# Patient Record
Sex: Female | Born: 1970 | Hispanic: Yes | Marital: Single | Smoking: Never smoker
Health system: Southern US, Community
[De-identification: ages and names within clinical notes are randomized; demographics above are authoritative.]

## PROBLEM LIST (undated history)

## (undated) DIAGNOSIS — M069 Rheumatoid arthritis, unspecified: Secondary | ICD-10-CM

## (undated) DIAGNOSIS — M1990 Unspecified osteoarthritis, unspecified site: Secondary | ICD-10-CM

## (undated) DIAGNOSIS — G43909 Migraine, unspecified, not intractable, without status migrainosus: Secondary | ICD-10-CM

## (undated) DIAGNOSIS — J45909 Unspecified asthma, uncomplicated: Secondary | ICD-10-CM

## (undated) HISTORY — PX: CHOLECYSTECTOMY: SHX55

---

## 2019-03-21 ENCOUNTER — Emergency Department: Payer: Self-pay

## 2019-03-21 ENCOUNTER — Other Ambulatory Visit: Payer: Self-pay

## 2019-03-21 ENCOUNTER — Encounter: Payer: Self-pay | Admitting: *Deleted

## 2019-03-21 ENCOUNTER — Emergency Department
Admission: EM | Admit: 2019-03-21 | Discharge: 2019-03-21 | Disposition: A | Discharge status: Home / Self Care | Payer: Self-pay | Attending: Emergency Medicine | Admitting: Emergency Medicine

## 2019-03-21 DIAGNOSIS — R21 Rash and other nonspecific skin eruption: Secondary | ICD-10-CM | POA: Insufficient documentation

## 2019-03-21 DIAGNOSIS — J45909 Unspecified asthma, uncomplicated: Secondary | ICD-10-CM | POA: Insufficient documentation

## 2019-03-21 HISTORY — DX: Rheumatoid arthritis, unspecified: M06.9

## 2019-03-21 HISTORY — DX: Unspecified osteoarthritis, unspecified site: M19.90

## 2019-03-21 HISTORY — DX: Unspecified asthma, uncomplicated: J45.909

## 2019-03-21 LAB — COMPREHENSIVE METABOLIC PANEL
ALT: 23 U/L (ref 0–44)
AST: 29 U/L (ref 15–41)
Albumin: 4.2 g/dL (ref 3.5–5.0)
Alkaline Phosphatase: 79 U/L (ref 38–126)
Anion gap: 8 (ref 5–15)
BUN: 23 mg/dL — ABNORMAL HIGH (ref 6–20)
CO2: 23 mmol/L (ref 22–32)
Calcium: 8.9 mg/dL (ref 8.9–10.3)
Chloride: 106 mmol/L (ref 98–111)
Creatinine, Ser: 0.75 mg/dL (ref 0.44–1.00)
GFR calc Af Amer: >60 mL/min (ref >60)
GFR calc non Af Amer: >60 mL/min (ref >60)
Glucose, Bld: 120 mg/dL — ABNORMAL HIGH (ref 70–99)
Potassium: 4.3 mmol/L (ref 3.5–5.1)
Sodium: 137 mmol/L (ref 135–145)
Total Bilirubin: 1.2 mg/dL (ref 0.3–1.2)
Total Protein: 7 g/dL (ref 6.5–8.1)

## 2019-03-21 LAB — CBC WITH DIFFERENTIAL/PLATELET
Abs Immature Granulocytes: 0.03 10*3/uL (ref 0.00–0.07)
Basophils Absolute: 0 10*3/uL (ref 0.0–0.1)
Basophils Relative: 1 %
Eosinophils Absolute: 0.4 10*3/uL (ref 0.0–0.5)
Eosinophils Relative: 6 %
HCT: 41.5 % (ref 36.0–46.0)
Hemoglobin: 13.7 g/dL (ref 12.0–15.0)
Immature Granulocytes: 1 %
Lymphocytes Relative: 32 %
Lymphs Abs: 1.8 10*3/uL (ref 0.7–4.0)
MCH: 27.2 pg (ref 26.0–34.0)
MCHC: 33 g/dL (ref 30.0–36.0)
MCV: 82.3 fL (ref 80.0–100.0)
Monocytes Absolute: 0.4 10*3/uL (ref 0.1–1.0)
Monocytes Relative: 8 %
Neutro Abs: 3 10*3/uL (ref 1.7–7.7)
Neutrophils Relative %: 52 %
Platelets: 177 10*3/uL (ref 150–400)
RBC: 5.04 MIL/uL (ref 3.87–5.11)
RDW: 13 % (ref 11.5–15.5)
WBC: 5.6 10*3/uL (ref 4.0–10.5)
nRBC: 0 % (ref 0.0–0.2)

## 2019-03-21 MED ORDER — CLINDAMYCIN HCL 150 MG PO CAPS
300.0000 mg | ORAL_CAPSULE | Freq: Once | ORAL | Status: AC
  Administered 2019-03-21: 300 mg via ORAL
  Filled 2019-03-21: qty 2

## 2019-03-21 MED ORDER — TRAMADOL HCL 50 MG PO TABS
50.0000 mg | ORAL_TABLET | Freq: Once | ORAL | Status: AC
  Administered 2019-03-21: 50 mg via ORAL
  Filled 2019-03-21: qty 1

## 2019-03-21 MED ORDER — TRAMADOL HCL 50 MG PO TABS: 50.0000 mg | ORAL_TABLET | Freq: Four times a day (QID) | ORAL | 0 refills | Status: DC | PRN

## 2019-03-21 MED ORDER — CLINDAMYCIN HCL 300 MG PO CAPS: 300.0000 mg | ORAL_CAPSULE | Freq: Three times a day (TID) | ORAL | 0 refills | Status: DC

## 2019-03-21 MED ORDER — CLINDAMYCIN HCL 300 MG PO CAPS: 300.0000 mg | ORAL_CAPSULE | Freq: Three times a day (TID) | ORAL | 0 refills | Status: AC

## 2019-03-21 NOTE — ED Provider Notes (Signed)
[facility] Emergency Department Provider Note  ____________________________________________  Time seen: Approximately 3:05 PM  I have reviewed the triage vital signs and the nursing notes.   HISTORY  Chief Complaint Rash    HPI Nichole Martin is a 48 y.o. female who presents to the emergency department complaining of ongoing cellulitis/leg ulcers x2 months.  Patient reports that she has a history of recurrent "infections" to the bilateral lower extremities.  Patient reports that approximately 2 months ago she developed a "pinhole" wound to the anterior left lower extremity.  Patient reports that this worsens, developed erythema so she presented to her primary care provider who diagnosed her with cellulitis.  Patient was initially started on Keflex, had allergic reaction to same.  This antibiotic was stopped and patient was placed on a second antibiotic.  Patient is unsure what the second antibiotic was.  She states that she took the entire length of antibiotics, her wound appeared to heal somewhat, but after finishing the antibiotics it is slowly worsened.  She reports that it is mildly painful, it is erythematous but she has had no purulent drainage or worsening of skin erosions.  Patient denies any significant past medical history to include hypertension, diabetes.  Patient states that she called her primary care provider today and was referred to the emergency department for evaluation.  She denies any fevers or chills, chest pain, shortness of breath abdominal pain, nausea vomiting.  Other than the prescribed antibiotics, patient has had no medication for this complaint.  Patient states that her previous "infections" have all been on her lower extremities.         Past Medical History:  Diagnosis Date  . Arthritis   . Asthma   . Rheumatoid arthritis (HCC)     There are no active problems to display for this patient.   Past Surgical History:   Procedure Laterality Date  . CESAREAN SECTION    . CHOLECYSTECTOMY      Prior to Admission medications   Not on File    Allergies Indomethacin and Cephalexin  No family history on file.  Social History Social History   Tobacco Use  . Smoking status: Never Smoker  . Smokeless tobacco: Never Used  Substance Use Topics  . Alcohol use: Never    Frequency: Never  . Drug use: Never     Review of Systems  Constitutional: No fever/chills Eyes: No visual changes. No discharge ENT: No upper respiratory complaints. Cardiovascular: no chest pain. Respiratory: no cough. No SOB. Gastrointestinal: No abdominal pain.  No nausea, no vomiting.  No diarrhea.  No constipation. Musculoskeletal: Positive for pain, erythema, edema, skin changes to left lower extremity x2 months Skin: Negative for rash, abrasions, lacerations, ecchymosis. Neurological: Negative for headaches, focal weakness or numbness. 10-point ROS otherwise negative.  ____________________________________________   PHYSICAL EXAM:  VITAL SIGNS: ED Triage Vitals  Enc Vitals Group     BP 03/21/19 1410 133/72     Pulse Rate 03/21/19 1410 84     Resp 03/21/19 1410 18     Temp 03/21/19 1410 97.8 F (36.6 C)     Temp Source 03/21/19 1410 Oral     SpO2 03/21/19 1410 98 %     Weight 03/21/19 1422 215 lb (97.5 kg)     Height 03/21/19 1422 5\' 3"  (1.6 m)     Head Circumference --      Peak Flow --      Pain Score 03/21/19 1422 10  Pain Loc --      Pain Edu? --      Excl. in GC? --      Constitutional: Alert and oriented. Well appearing and in no acute distress. Eyes: Conjunctivae are normal. PERRL. EOMI. Head: Atraumatic. ENT:      Ears:       Nose: No congestion/rhinnorhea.      Mouth/Throat: Mucous membranes are moist.  Neck: No stridor.    Cardiovascular: Normal rate, regular rhythm. Normal S1 and S2.  Good peripheral circulation. Respiratory: Normal respiratory effort without tachypnea or retractions.  Lungs CTAB. Good air entry to the bases with no decreased or absent breath sounds. Musculoskeletal: Full range of motion to all extremities. No gross deformities appreciated.  Visualization of the left lower extremity reveals mild erythema, moderate edema of the left calf region.  Majority of erythema and edema is along the anterior aspect of the lower extremity.  Patient has multiple wounds to the anterior leg.  Surrounding erythema and edema is around these wounds.  There is no purulent drainage.  No fluctuance.  Area is tender to palpation with no palpable abnormalities.  Dorsalis pedis pulse intact distally.  Sensation intact distally.  Total area of the anterior shin measures approximately 10 cm x 8 cm. Neurologic:  Normal speech and language. No gross focal neurologic deficits are appreciated.  Skin:  Skin is warm, dry and intact. No rash noted. Psychiatric: Mood and affect are normal. Speech and behavior are normal. Patient exhibits appropriate insight and judgement.   ____________________________________________   LABS (all labs ordered are listed, but only abnormal results are displayed)  Labs Reviewed  COMPREHENSIVE METABOLIC PANEL - Abnormal; Notable for the following components:      Result Value   Glucose, Bld 120 (*)    BUN 23 (*)    All other components within normal limits  CBC WITH DIFFERENTIAL/PLATELET  CBC WITH DIFFERENTIAL/PLATELET   ____________________________________________  EKG   ____________________________________________  RADIOLOGY I personally viewed and evaluated these images as part of my medical decision making, as well as reviewing the written report by the radiologist.  [place] Venous Img Lower Unilateral Left  Result Date: 03/21/2019 CLINICAL DATA:  Left lower extremity pain, edema EXAM: LEFT LOWER EXTREMITY VENOUS DOPPLER ULTRASOUND TECHNIQUE: Gray-scale sonography with graded compression, as well as color Doppler and duplex ultrasound were performed  to evaluate the lower extremity deep venous systems from the level of the common femoral vein and including the common femoral, femoral, profunda femoral, popliteal and calf veins including the posterior tibial, peroneal and gastrocnemius veins when visible. The superficial great saphenous vein was also interrogated. Spectral Doppler was utilized to evaluate flow at rest and with distal augmentation maneuvers in the common femoral, femoral and popliteal veins. COMPARISON:  None. FINDINGS: Contralateral Common Femoral Vein: Respiratory phasicity is normal and symmetric with the symptomatic side. No evidence of thrombus. Normal compressibility. Common Femoral Vein: No evidence of thrombus. Normal compressibility, respiratory phasicity and response to augmentation. Saphenofemoral Junction: No evidence of thrombus. Normal compressibility and flow on color Doppler imaging. Profunda Femoral Vein: No evidence of thrombus. Normal compressibility and flow on color Doppler imaging. Femoral Vein: No evidence of thrombus. Normal compressibility, respiratory phasicity and response to augmentation. Popliteal Vein: No evidence of thrombus. Normal compressibility, respiratory phasicity and response to augmentation. Calf Veins: No evidence of thrombus. Normal compressibility and flow on color Doppler imaging. Superficial Great Saphenous Vein: No evidence of thrombus. Normal compressibility. Venous Reflux:  Not assessed Other Findings:  None. IMPRESSION: No evidence of deep venous thrombosis. Electronically Signed   By: Jerilynn Mages.  Shick M.D.   On: 03/21/2019 16:44    ____________________________________________    PROCEDURES  Procedure(s) performed:    Procedures    Medications - No data to display   ____________________________________________   INITIAL IMPRESSION / ASSESSMENT AND PLAN / ED COURSE  Pertinent labs & imaging results that were available during my care of the patient were reviewed by me and considered  in my medical decision making (see chart for details).  Review of the [place] CSRS was performed in accordance of the [place] prior to dispensing any controlled drugs.  Clinical Course as of Mar 21 1703  Fri Mar 21, 2019  1606 Patient presented to the emergency department complaining of left lower extremity pain, ongoing superficial wound/skin changes, erythema x2 months.  Patient was placed on Keflex initially and had allergic reaction to same.  Patient then was started on a second antibiotic that she does not know the name of.  She reports that this improved her symptoms somewhat but they never fully alleviated.  She does have a history of recurrent wounds to the bilateral lower extremities.  She denies that these are traumatic.  She states that they started off as "pinholes" and then turned into erosion type wounds.  Patient has been dealing with current left lower extremity findings x2 months.  On evaluation, patient does have erythema, edema of the left lower extremity.  On the anterior aspect of her leg she has multiple superficial erosion type injuries.  Given the chronic nature of these findings, visual evaluation of the area I suspect that these are minimally infected chronic venous stasis ulcerations.  Patient has no history of vascular insufficiency, cardiac history, hypertension or diabetes.  As she has had 2 months of similar appearing wounds without any worsening, I do think they are minimally infected, with very little risk of deep underlying abscess or systemic effects such as sepsis.  I think most of findings are consistent with venous stasis ulcerations.  Labs, ultrasound will be obtained at this point.     [JC]  1194 At this time, CBC hemolyzed prior to results.  There has been difficulty obtaining a second set of lab work.  At this time, patient care will be transferred to colleague, Laban Emperor PA-C.  Patient's history, physical exam, work-up to this point of been discussed with next provider.   Final diagnosis and disposition will be provided by Ms. Shawna Orleans   [JC]    Clinical Course User Index [JC] , Charline Bills, PA-C          Patient care transferred to Laban Emperor, PA-C pending CBC and ultrasound results.  Final diagnosis and disposition will be provided by Ms. Shawna Orleans ____________________________________________  FINAL CLINICAL IMPRESSION(S) / ED DIAGNOSES  Final diagnoses:  None      NEW MEDICATIONS STARTED DURING THIS VISIT:  ED Discharge Orders    None          This chart was dictated using voice recognition software/Dragon. Despite best efforts to proofread, errors can occur which can change the meaning. Any change was purely unintentional.    Darletta Moll, PA-C 03/21/19 1704    Nance Pear, MD 03/21/19 1800

## 2019-03-21 NOTE — ED Notes (Signed)
Pt otf for imaging 

## 2019-03-21 NOTE — Discharge Instructions (Addendum)
Your blood work is all very reassuring.  There is no clot in your leg.  I suspect that you have some venous stasis ulcers to your lower legs from blood not effectively pumping blood to your legs.  I have given you an antibiotic for any infection.  Please follow-up with primary care and wound center for further work-up.

## 2019-03-21 NOTE — ED Notes (Signed)
Lab to come and draw lavender tube that was hemolyzed

## 2019-03-21 NOTE — ED Triage Notes (Signed)
Through Spanish interpreter Nichole Martin, patient states she has had a rash on left lower leg for two months. Patient was started on Cephalexin on 9/11, but had an allergic reaction. Patient was started on a 2nd antibiotic and a steroid for the allergic reaction and finished the 2nd antibiotic. Patient went for a wound check today and was referred here for further treatment for possible cellulitis.

## 2019-04-07 ENCOUNTER — Encounter: Payer: Self-pay | Attending: Physician Assistant | Admitting: Physician Assistant

## 2019-04-07 ENCOUNTER — Other Ambulatory Visit: Payer: Self-pay

## 2019-04-07 DIAGNOSIS — L98492 Non-pressure chronic ulcer of skin of other sites with fat layer exposed: Secondary | ICD-10-CM | POA: Insufficient documentation

## 2019-04-07 DIAGNOSIS — L97812 Non-pressure chronic ulcer of other part of right lower leg with fat layer exposed: Secondary | ICD-10-CM | POA: Insufficient documentation

## 2019-04-07 DIAGNOSIS — Z8249 Family history of ischemic heart disease and other diseases of the circulatory system: Secondary | ICD-10-CM | POA: Insufficient documentation

## 2019-04-07 DIAGNOSIS — Z881 Allergy status to other antibiotic agents status: Secondary | ICD-10-CM | POA: Insufficient documentation

## 2019-04-07 DIAGNOSIS — I872 Venous insufficiency (chronic) (peripheral): Secondary | ICD-10-CM | POA: Insufficient documentation

## 2019-04-07 DIAGNOSIS — I10 Essential (primary) hypertension: Secondary | ICD-10-CM | POA: Insufficient documentation

## 2019-04-07 DIAGNOSIS — M069 Rheumatoid arthritis, unspecified: Secondary | ICD-10-CM | POA: Insufficient documentation

## 2019-04-07 DIAGNOSIS — J45909 Unspecified asthma, uncomplicated: Secondary | ICD-10-CM | POA: Insufficient documentation

## 2019-04-07 DIAGNOSIS — L97822 Non-pressure chronic ulcer of other part of left lower leg with fat layer exposed: Secondary | ICD-10-CM | POA: Insufficient documentation

## 2019-04-07 NOTE — Progress Notes (Signed)
SALVADOR, BIGBEE ([id number]) Visit Report for 04/07/2019 Abuse/Suicide Risk Screen Details Patient Name: Nichole Martin, Nichole Martin Date of Service: 04/07/2019 1:15 PM Medical Record Number: [id number] Patient Account Number: [id number] Date of Birth/Sex: 28-Apr-1971 (48 y.o. F) Treating RN: Curtis Sites Primary Care Deontaye Civello: Tonia Ghent Other Clinician: Referring Kenecia Barren: Phineas Semen Treating Daysia Vandenboom/Extender: Linwood Dibbles, HOYT Weeks in Treatment: 0 Abuse/Suicide Risk Screen Items Answer ABUSE RISK SCREEN: Has anyone close to you tried to hurt or harm you recentlyo No Do you feel uncomfortable with anyone in your familyo No Has anyone forced you do things that you didnot want to doo No Electronic Signature(s) Signed: 04/07/2019 3:46:08 PM By: Curtis Sites Entered By: Curtis Sites on 04/07/2019 13:42:06 Nichole Martin ([id number]) -------------------------------------------------------------------------------- Activities of Daily Living Details Patient Name: Nichole Martin Date of Service: 04/07/2019 1:15 PM Medical Record Number: [id number] Patient Account Number: [id number] Date of Birth/Sex: Mar 15, 1971 (48 y.o. F) Treating RN: Curtis Sites Primary Care Tedford Berg: Tonia Ghent Other Clinician: Referring Rosemarie Galvis: Phineas Semen Treating Isaish Alemu/Extender: Linwood Dibbles, HOYT Weeks in Treatment: 0 Activities of Daily Living Items Answer Activities of Daily Living (Please select one for each item) Drive Automobile Completely Able Take Medications Completely Able Use Telephone Completely Able Care for Appearance Completely Able Use Toilet Completely Able Bath / Shower Completely Able Dress Self Completely Able Feed Self Completely Able Walk Completely Able Get In / Out Bed Completely Able Housework Completely Able Prepare Meals Completely Able Handle Money Completely Able Shop for Self Completely Able Electronic Signature(s) Signed:  04/07/2019 3:46:08 PM By: Curtis Sites Entered By: Curtis Sites on 04/07/2019 13:42:44 Nichole Martin ([id number]) -------------------------------------------------------------------------------- Education Screening Details Patient Name: Nichole Martin Date of Service: 04/07/2019 1:15 PM Medical Record Number: [id number] Patient Account Number: [id number] Date of Birth/Sex: 1970-11-13 (48 y.o. F) Treating RN: Curtis Sites Primary Care Aadarsh Cozort: Tonia Ghent Other Clinician: Referring Jakeria Caissie: Phineas Semen Treating Mairany Bruno/Extender: Linwood Dibbles, HOYT Weeks in Treatment: 0 Primary Learner Assessed: Patient Learning Preferences/Education Level/Primary Language Learning Preference: Explanation, Demonstration Highest Education Level: High School Preferred Language: Spanish; Set designer Language Barrier: Yes Translator Needed: No Memory Deficit: No Emotional Barrier: No Cultural/Religious Beliefs Affecting Medical Care: No Physical Barrier Impaired Vision: No Impaired Hearing: No Decreased Hand dexterity: No Knowledge/Comprehension Knowledge Level: Medium Comprehension Level: Medium Ability to understand written Medium instructions: Ability to understand verbal Medium instructions: Motivation Anxiety Level: Calm Cooperation: Cooperative Education Importance: Acknowledges Need Interest in Health Problems: Asks Questions Perception: Coherent Willingness to Engage in Self- Medium Management Activities: Readiness to Engage in Self- Medium Management Activities: Electronic Signature(s) Signed: 04/07/2019 3:46:08 PM By: Curtis Sites Entered By: Curtis Sites on 04/07/2019 13:43:12 Nichole Martin ([id number]) -------------------------------------------------------------------------------- Fall Risk Assessment Details Patient Name: Nichole Martin Date of Service: 04/07/2019 1:15 PM Medical Record Number:  [id number] Patient Account Number: [id number] Date of Birth/Sex: Dec 17, 1970 (48 y.o. F) Treating RN: Curtis Sites Primary Care Kedric Bumgarner: Tonia Ghent Other Clinician: Referring Sayward Horvath: Phineas Semen Treating Tonatiuh Mallon/Extender: Linwood Dibbles, HOYT Weeks in Treatment: 0 Fall Risk Assessment Items Have you had 2 or more falls in the last 12 monthso 0 No Have you had any fall that resulted in injury in the last 12 monthso 0 No FALLS RISK SCREEN History of falling - immediate or within 3 months 0 No Secondary diagnosis (Do you have 2 or more medical diagnoseso) 0 No Ambulatory aid None/bed rest/wheelchair/nurse 0 Yes Crutches/cane/walker 0 No Furniture 0 No Intravenous therapy Access/Saline/Heparin Lock 0 No Gait/Transferring Normal/ bed rest/ wheelchair 0 Yes  Weak (short steps with or without shuffle, stooped but able to lift head while 0 No walking, may seek support from furniture) Impaired (short steps with shuffle, may have difficulty arising from chair, head 0 No down, impaired balance) Mental Status Oriented to own ability 0 Yes Electronic Signature(s) Signed: 04/07/2019 3:46:08 PM By: Montey Hora Entered By: Montey Hora on 04/07/2019 13:43:27 Nichole Martin ([id number]) -------------------------------------------------------------------------------- Foot Assessment Details Patient Name: Nichole Martin Date of Service: 04/07/2019 1:15 PM Medical Record Number: [id number] Patient Account Number: [id number] Date of Birth/Sex: March 26, 1971 (48 y.o. F) Treating RN: Montey Hora Primary Care Mukesh Kornegay: Rutherford Guys Other Clinician: Referring Kenyette Gundy: Nance Pear Treating Jalysa Swopes/Extender: Melburn Hake, HOYT Weeks in Treatment: 0 Foot Assessment Items Site Locations + = Sensation present, - = Sensation absent, C = Callus, U = Ulcer R = Redness, W = Warmth, M = Maceration, PU = Pre-ulcerative lesion F = Fissure, S = Swelling, D =  Dryness Assessment Right: Left: Other Deformity: No No Prior Foot Ulcer: No No Prior Amputation: No No Charcot Joint: No No Ambulatory Status: Ambulatory Without Help Gait: Steady Electronic Signature(s) Signed: 04/07/2019 3:46:08 PM By: Montey Hora Entered By: Montey Hora on 04/07/2019 13:43:55 Nichole Martin ([id number]) -------------------------------------------------------------------------------- Nutrition Risk Screening Details Patient Name: Nichole Martin Date of Service: 04/07/2019 1:15 PM Medical Record Number: [id number] Patient Account Number: [id number] Date of Birth/Sex: 06-23-1970 (48 y.o. F) Treating RN: Montey Hora Primary Care Sharice Harriss: Rutherford Guys Other Clinician: Referring Orlando Devereux: Nance Pear Treating Melvina Pangelinan/Extender: Melburn Hake, HOYT Weeks in Treatment: 0 Height (in): 66 Weight (lbs): 221 Body Mass Index (BMI): 35.7 Nutrition Risk Screening Items Score Screening NUTRITION RISK SCREEN: I have an illness or condition that made me change the kind and/or amount of 0 No food I eat I eat fewer than two meals per day 0 No I eat few fruits and vegetables, or milk products 0 No I have three or more drinks of beer, liquor or wine almost every day 0 No I have tooth or mouth problems that make it hard for me to eat 0 No I don't always have enough money to buy the food I need 0 No I eat alone most of the time 0 No I take three or more different prescribed or over-the-counter drugs a day 0 No Without wanting to, I have lost or gained 10 pounds in the last six months 0 No I am not always physically able to shop, cook and/or feed myself 0 No Nutrition Protocols Good Risk Protocol 0 No interventions needed Moderate Risk Protocol High Risk Proctocol Risk Level: Good Risk Score: 0 Electronic Signature(s) Signed: 04/07/2019 3:46:08 PM By: Montey Hora Entered By: Montey Hora on 04/07/2019 13:43:45

## 2019-04-07 NOTE — Progress Notes (Signed)
PRICILLA, MOEHLE ([id number]) Visit Report for 04/07/2019 Allergy List Details Patient Name: NICKOLETTE, ESPINOLA Date of Service: 04/07/2019 1:15 PM Medical Record Number: [id number] Patient Account Number: [id number] Date of Birth/Sex: [birth year] (48 y.o. F) Treating RN: Montey Hora Primary Care Evah Rashid: Rutherford Guys Other Clinician: Referring Shelah Heatley: Nance Pear Treating Karalynn Cottone/Extender: STONE III, HOYT Weeks in Treatment: 0 Allergies Active Allergies indomethacin cephalexin Reaction: Hives Allergy Notes Electronic Signature(s) Signed: 04/07/2019 3:46:08 PM By: Montey Hora Entered By: Montey Hora on 04/07/2019 13:41:57 [name] ([id number]) -------------------------------------------------------------------------------- Arrival Information Details Patient Name: [name] Date of Service: 04/07/2019 1:15 PM Medical Record Number: [id number] Patient Account Number: [id number] Date of Birth/Sex: 08/08/1970 (48 y.o. F) Treating RN: Harold Barban Primary Care Shawnta Schlegel: Rutherford Guys Other Clinician: Referring Zackory Pudlo: Nance Pear Treating Ann Bohne/Extender: Melburn Hake, HOYT Weeks in Treatment: 0 Visit Information Patient Arrived: Ambulatory Arrival Time: 13:35 Accompanied By: daughter and interpreter Transfer Assistance: None Patient Identification Verified: Yes Secondary Verification Process Yes Completed: Electronic Signature(s) Signed: 04/07/2019 2:00:38 PM By: Lorine Bears RCP, RRT, CHT Entered By: Lorine Bears on 04/07/2019 13:37:56 FLORES Darlin Priestly ([id number]) -------------------------------------------------------------------------------- Clinic Level of Care Assessment Details Patient Name: [name] Date of Service: 04/07/2019 1:15 PM Medical Record Number: [id number] Patient Account Number: [id number] Date of Birth/Sex: 06-Apr-1971 (48 y.o. F) Treating  RN: Harold Barban Primary Care Alexsis Branscom: Rutherford Guys Other Clinician: Referring Erick Oxendine: Nance Pear Treating Aleia Larocca/Extender: Melburn Hake, HOYT Weeks in Treatment: 0 Clinic Level of Care Assessment Items TOOL 2 Quantity Score []  - Use when only an EandM is performed on the INITIAL visit 0 ASSESSMENTS - Nursing Assessment / Reassessment X - General Physical Exam (combine w/ comprehensive assessment (listed just below) when 1 20 performed on new pt. evals) X- 1 25 Comprehensive Assessment (HX, ROS, Risk Assessments, Wounds Hx, etc.) ASSESSMENTS - Wound and Skin Assessment / Reassessment []  - Simple Wound Assessment / Reassessment - one wound 0 X- 2 5 Complex Wound Assessment / Reassessment - multiple wounds []  - 0 Dermatologic / Skin Assessment (not related to wound area) ASSESSMENTS - Ostomy and/or Continence Assessment and Care []  - Incontinence Assessment and Management 0 []  - 0 Ostomy Care Assessment and Management (repouching, etc.) PROCESS - Coordination of Care X - Simple Patient / Family Education for ongoing care 1 15 []  - 0 Complex (extensive) Patient / Family Education for ongoing care []  - 0 Staff obtains Programmer, systems, Records, Test Results / Process Orders []  - 0 Staff telephones HHA, Nursing Homes / Clarify orders / etc []  - 0 Routine Transfer to another Facility (non-emergent condition) []  - 0 Routine Hospital Admission (non-emergent condition) []  - 0 New Admissions / Biomedical engineer / Ordering NPWT, Apligraf, etc. []  - 0 Emergency Hospital Admission (emergent condition) X- 1 10 Simple Discharge Coordination []  - 0 Complex (extensive) Discharge Coordination PROCESS - Special Needs []  - Pediatric / Minor Patient Management 0 []  - 0 Isolation Patient Management TAKEIRA, YANES ([id number]) []  - 0 Hearing / Language / Visual special needs []  - 0 Assessment of Community assistance (transportation, D/C planning, etc.) []  -  0 Additional assistance / Altered mentation []  - 0 Support Surface(s) Assessment (bed, cushion, seat, etc.) INTERVENTIONS - Wound Cleansing / Measurement X - Wound Imaging (photographs - any number of wounds) 1 5 []  - 0 Wound Tracing (instead of photographs) []  - 0 Simple Wound Measurement - one wound X- 2 5 Complex Wound Measurement - multiple wounds []  - 0 Simple Wound Cleansing -  one wound X- 2 5 Complex Wound Cleansing - multiple wounds INTERVENTIONS - Wound Dressings X - Small Wound Dressing one or multiple wounds 2 10 []  - 0 Medium Wound Dressing one or multiple wounds []  - 0 Large Wound Dressing one or multiple wounds []  - 0 Application of Medications - injection INTERVENTIONS - Miscellaneous []  - External ear exam 0 []  - 0 Specimen Collection (cultures, biopsies, blood, body fluids, etc.) []  - 0 Specimen(s) / Culture(s) sent or taken to Lab for analysis []  - 0 Patient Transfer (multiple staff / / Similar devices) []  - 0 Simple Staple / Suture removal (25 or less) []  - 0 Complex Staple / Suture removal (26 or more) []  - 0 Hypo / Hyperglycemic Management (close monitor of Blood Glucose) X- 1 15 Ankle / Brachial Index (ABI) - do not check if billed separately Has the patient been seen at the hospital within the last three years: Yes Total Score: 140 Level Of Care: New/Established - Level 4 Electronic Signature(s) Signed: 04/07/2019 4:25:43 PM By: Entered By: on 04/07/2019 14:12:08 ( ) -------------------------------------------------------------------------------- Encounter Discharge Information Details Patient Name: Nurse, adult Date of Service: 04/07/2019 1:15 PM Medical Record Number: Patient Account Number: Date of Birth/Sex: [birth year] (48 y.o. F) Treating RN: Arnette Norris Primary Care Lynde Ludwig: 13/07/2018 Other Clinician: Referring Raider Valbuena:  [name] Treating Destani Wamser/Extender: [id number], HOYT Weeks in Treatment: 0 Encounter Discharge Information Items Discharge Condition: Stable Ambulatory Status: Ambulatory Discharge Destination: Home Transportation: Private Auto Accompanied By: family Schedule Follow-up Appointment: Yes Clinical Summary of Care: Electronic Signature(s) Signed: 04/07/2019 4:25:43 PM By: 13/07/2018 Entered By: [id number] on 04/07/2019 14:19:56 08/05/1970 (44) -------------------------------------------------------------------------------- Lower Extremity Assessment Details Patient Name: Arnette Norris Date of Service: 04/07/2019 1:15 PM Medical Record Number: Phineas Semen Patient Account Number: Linwood Dibbles Date of Birth/Sex: 1970/07/21 (48 y.o. F) Treating RN: Arnette Norris Primary Care Zyaira Vejar: 13/07/2018 Other Clinician: Referring Revere Maahs: [name] Treating Johnsie Moscoso/Extender: [id number], HOYT Weeks in Treatment: 0 Edema Assessment Assessed: [Left: No] [Right: No] Edema: [Left: No] [Right: No] Calf Left: Right: Point of Measurement: 32 cm From Medial Instep 39 cm 38 cm Ankle Left: Right: Point of Measurement: 10 cm From Medial Instep 24 cm 21 cm Vascular Assessment Pulses: Dorsalis Pedis Palpable: [Left:Yes] [Right:Yes] Doppler Audible: [Left:Yes] [Right:Yes] Posterior Tibial Palpable: [Left:Yes] [Right:Yes] Doppler Audible: [Left:Yes] [Right:Yes] Blood Pressure: Brachial: [Left:154] Dorsalis Pedis: 178 [Left:Dorsalis Pedis: 157] Ankle: Posterior Tibial: 174 [Left:Posterior Tibial: 162 1.16] [Right:1.05] Electronic Signature(s) Signed: 04/07/2019 1:55:15 PM By: 13/07/2018 Entered By: [id number] on 04/07/2019 13:55:15 08/05/1970 (44) -------------------------------------------------------------------------------- Multi Wound Chart Details Patient Name: Curtis Sites Date of Service:  04/07/2019 1:15 PM Medical Record Number: Phineas Semen Patient Account Number: Linwood Dibbles Date of Birth/Sex: 02/26/1971 (48 y.o. F) Treating RN: Curtis Sites Primary Care Gabor Lusk: 13/07/2018 Other Clinician: Referring Momina Hunton: [name] Treating Kresha Abelson/Extender: [id number], HOYT Weeks in Treatment: 0 Vital Signs Height(in): 66 Pulse(bpm): 82 Weight(lbs): 221 Blood Pressure(mmHg): 154/93 Body Mass Index(BMI): 36 Temperature(F): 97.9 Respiratory Rate 16 (breaths/min): Photos: [N/A:N/A] Wound Location: Right Lower Leg - Medial Left Lower Leg - N/A Circumferential Wounding Event: Gradually Appeared Gradually Appeared N/A Primary Etiology: Venous Leg Ulcer Venous Leg Ulcer N/A Comorbid History: Asthma, Rheumatoid Arthritis, Asthma, Rheumatoid Arthritis, N/A Osteoarthritis Osteoarthritis Date Acquired: 01/04/2019 01/04/2019 N/A Weeks of Treatment: 0 0 N/A Wound Status: Open Open N/A Measurements L x W x D 7.6x2.3x0.1 22x28x0.1 N/A (cm) Area (cm) :  13.729 483.805 N/A Volume (cm) : 1.373 48.381 N/A Classification: Full Thickness Without Full Thickness Without N/A Exposed Support Structures Exposed Support Structures Exudate Amount: Medium Medium N/A Exudate Type: Serous Serous N/A Exudate Color: amber amber N/A Wound Margin: Flat and Intact Flat and Intact N/A Granulation Amount: Medium (34-66%) Medium (34-66%) N/A Granulation Quality: Pink Pink N/A Necrotic Amount: Medium (34-66%) Medium (34-66%) N/A Exposed Structures: Fat Layer (Subcutaneous Fat Layer (Subcutaneous N/A Tissue) Exposed: Yes Tissue) Exposed: Yes Fascia: No Fascia: No Tendon: No Tendon: No Muscle: No Muscle: No Joint: No Joint: No Bone: No Bone: No ALICIAH SANTOY, TERESA ([id number]) Epithelialization: Small (1-33%) Small (1-33%) N/A Treatment Notes Electronic Signature(s) Signed: 04/07/2019 4:25:43 PM By: Arnette Norris Entered By: Arnette Norris on 04/07/2019 14:09:33 [name] ([id number]) -------------------------------------------------------------------------------- Multi-Disciplinary Care Plan Details Patient Name: [name] Date of Service: 04/07/2019 1:15 PM Medical Record Number: [id number] Patient Account Number: [id number] Date of Birth/Sex: 1971/03/18 (48 y.o. F) Treating RN: Arnette Norris Primary Care Marchia Diguglielmo: Tonia Ghent Other Clinician: Referring Dondi Burandt: Phineas Semen Treating Lorean Ekstrand/Extender: Linwood Dibbles, HOYT Weeks in Treatment: 0 Active Inactive Venous Leg Ulcer Nursing Diagnoses: Knowledge deficit related to disease process and management Goals: Patient will maintain optimal edema control Date Initiated: 04/07/2019 Target Resolution Date: 05/07/2019 Goal Status: Active Interventions: Assess peripheral edema status every visit. Compression as ordered Notes: Wound/Skin Impairment Nursing Diagnoses: Impaired tissue integrity Goals: Ulcer/skin breakdown will have a volume reduction of 30% by week 4 Date Initiated: 04/07/2019 Target Resolution Date: 05/07/2019 Goal Status: Active Interventions: Assess patient/caregiver ability to obtain necessary supplies Assess patient/caregiver ability to perform ulcer/skin care regimen upon admission and as needed Assess ulceration(s) every visit Treatment Activities: Skin care regimen initiated : 04/07/2019 Notes: Electronic Signature(s) Signed: 04/07/2019 2:53:35 PM By: Arnette Norris Entered By: Arnette Norris on 04/07/2019 14:53:34 [name] ([id number]) -------------------------------------------------------------------------------- Pain Assessment Details Patient Name: [name] Date of Service: 04/07/2019 1:15 PM Medical Record Number: [id number] Patient Account Number: [id number] Date of Birth/Sex: 05/19/1971 (48 y.o. F) Treating RN: Arnette Norris Primary Care Allexa Acoff: Tonia Ghent Other Clinician: Referring Asyria Kolander: Phineas Semen Treating Ulyana Pitones/Extender: Linwood Dibbles, HOYT Weeks in Treatment: 0 Active Problems Location of Pain Severity and Description of Pain Patient Has Paino Yes Site Locations Rate the pain. Current Pain Level: 8 Pain Management and Medication Current Pain Management: Electronic Signature(s) Signed: 04/07/2019 2:00:38 PM By: Dayton Martes RCP, RRT, CHT Signed: 04/07/2019 4:25:43 PM By: Arnette Norris Entered By: Dayton Martes on 04/07/2019 13:38:29 [name] ([id number]) -------------------------------------------------------------------------------- Patient/Caregiver Education Details Patient Name: [name] Date of Service: 04/07/2019 1:15 PM Medical Record Number: [id number] Patient Account Number: [id number] Date of Birth/Gender: 12/14/1970 (48 y.o. F) Treating RN: Arnette Norris Primary Care Physician: Tonia Ghent Other Clinician: Referring Physician: Phineas Semen Treating Physician/Extender: Linwood Dibbles, HOYT Weeks in Treatment: 0 Education Assessment Education Provided To: Patient Education Topics Provided Wound/Skin Impairment: Handouts: Caring for Your Ulcer Methods: Demonstration, Explain/Verbal Responses: State content correctly Electronic Signature(s) Signed: 04/07/2019 4:25:43 PM By: Arnette Norris Entered By: Arnette Norris on 04/07/2019 14:09:51 [name] ([id number]) -------------------------------------------------------------------------------- Wound Assessment Details Patient Name: [name] Date of Service: 04/07/2019 1:15 PM Medical Record Number: [id number] Patient Account Number: [id number] Date of Birth/Sex: Dec 13, 1970 (48 y.o. F) Treating RN: Curtis Sites Primary Care Geovannie Vilar: Tonia Ghent Other Clinician: Referring Tamiyah Moulin: Phineas Semen Treating Kristiana Jacko/Extender: Linwood Dibbles, HOYT Weeks in Treatment: 0 Wound Status Wound Number: 1 Primary Etiology:  Venous Leg Ulcer Wound Location: Right Lower Leg -  Medial Wound Status: Open Wounding Event: Gradually Appeared Comorbid Asthma, Rheumatoid Arthritis, History: Osteoarthritis Date Acquired: 01/04/2019 Weeks Of Treatment: 0 Clustered Wound: No Photos Wound Measurements Length: (cm) 7.6 Width: (cm) 2.3 Depth: (cm) 0.1 Area: (cm) 13.729 Volume: (cm) 1.373 % Reduction in Area: % Reduction in Volume: Epithelialization: Small (1-33%) Tunneling: No Undermining: No Wound Description Full Thickness Without Exposed Support Foul Odo Classification: Structures Slough/F Wound Margin: Flat and Intact Exudate Medium Amount: Exudate Type: Serous Exudate Color: amber r After Cleansing: No ibrino Yes Wound Bed Granulation Amount: Medium (34-66%) Exposed Structure Granulation Quality: Pink Fascia Exposed: No Necrotic Amount: Medium (34-66%) Fat Layer (Subcutaneous Tissue) Exposed: Yes Necrotic Quality: Adherent Slough Tendon Exposed: No Muscle Exposed: No Joint Exposed: No Bone Exposed: No FLORES VELAZQUEZ, TERESA ([id number]) Treatment Notes Wound #1 (Right, Medial Lower Leg) Notes TCA to bilateral legs (surrounding and on wounds), ABD, 3-Layer bilaterally, unna to anchor Electronic Signature(s) Signed: 04/07/2019 3:46:08 PM By: Curtis Sitesorthy, Joanna Entered By: Curtis Sitesorthy, Joanna on 04/07/2019 13:49:50 Tomie ChinaFLORES VELAZQUEZ, TERESA ([id number]) -------------------------------------------------------------------------------- Wound Assessment Details Patient Name: Tomie ChinaFLORES VELAZQUEZ, TERESA Date of Service: 04/07/2019 1:15 PM Medical Record Number: [id number] Patient Account Number: [id number] Date of Birth/Sex: 04/16/1971 (48 y.o. F) Treating RN: Curtis Sitesorthy, Joanna Primary Care Taviana Westergren: Tonia GhentBENDER, ABBY Other Clinician: Referring Ashliegh Parekh: Phineas SemenGoodman, Graydon Treating Adelene Polivka/Extender: Linwood DibblesSTONE III, HOYT Weeks in Treatment: 0 Wound Status Wound Number: 2 Primary Etiology: Venous Leg Ulcer Wound Location:  Left Lower Leg - Circumferential Wound Status: Open Wounding Event: Gradually Appeared Comorbid Asthma, Rheumatoid Arthritis, History: Osteoarthritis Date Acquired: 01/04/2019 Weeks Of Treatment: 0 Clustered Wound: No Photos Wound Measurements Length: (cm) 22 Width: (cm) 28 Depth: (cm) 0.1 Area: (cm) 483.805 Volume: (cm) 48.381 % Reduction in Area: % Reduction in Volume: Epithelialization: Small (1-33%) Tunneling: No Undermining: No Wound Description Full Thickness Without Exposed Support Foul Odo Classification: Structures Slough/F Wound Margin: Flat and Intact Exudate Medium Amount: Exudate Type: Serous Exudate Color: amber r After Cleansing: No ibrino Yes Wound Bed Granulation Amount: Medium (34-66%) Exposed Structure Granulation Quality: Pink Fascia Exposed: No Necrotic Amount: Medium (34-66%) Fat Layer (Subcutaneous Tissue) Exposed: Yes Necrotic Quality: Adherent Slough Tendon Exposed: No Muscle Exposed: No Joint Exposed: No Bone Exposed: No FLORES VELAZQUEZ, TERESA ([id number]) Treatment Notes Wound #2 (Left, Circumferential Lower Leg) Notes TCA to bilateral legs (surrounding and on wounds), ABD, 3-Layer bilaterally, unna to anchor Electronic Signature(s) Signed: 04/07/2019 3:46:08 PM By: Curtis Sitesorthy, Joanna Entered By: Curtis Sitesorthy, Joanna on 04/07/2019 13:51:07 Tomie ChinaFLORES VELAZQUEZ, TERESA ([id number]) -------------------------------------------------------------------------------- Vitals Details Patient Name: Tomie ChinaFLORES VELAZQUEZ, TERESA Date of Service: 04/07/2019 1:15 PM Medical Record Number: [id number] Patient Account Number: [id number] Date of Birth/Sex: 12/21/1970 (48 y.o. F) Treating RN: Arnette NorrisBiell, Kristina Primary Care Jayln Madeira: Tonia GhentBENDER, ABBY Other Clinician: Referring Corydon Schweiss: Phineas SemenGoodman, Graydon Treating Lenox Ladouceur/Extender: Linwood DibblesSTONE III, HOYT Weeks in Treatment: 0 Vital Signs Time Taken: 13:35 Temperature (F): 97.9 Height (in): 66 Pulse (bpm): 82 Source:  Stated Respiratory Rate (breaths/min): 16 Weight (lbs): 221 Blood Pressure (mmHg): 154/93 Source: Measured Reference Range: 80 - 120 mg / dl Body Mass Index (BMI): 35.7 Electronic Signature(s) Signed: 04/07/2019 2:00:38 PM By: Dayton MartesWallace, RCP,RRT,CHT, Sallie RCP, RRT, CHT Entered By: Dayton MartesWallace, RCP,RRT,CHT, Sallie on 04/07/2019 13:39:30

## 2019-04-09 NOTE — Progress Notes (Addendum)
JANEQUA, KIPNIS ([id number]) Visit Report for 04/07/2019 Chief Complaint Document Details Patient Name: Nichole Martin, Nichole Martin Date of Service: 04/07/2019 1:15 PM Medical Record Number: [id number] Patient Account Number: [id number] Date of Birth/Sex: [birth year] (48 y.o. F) Treating RN: Arnette Norris Primary Care Provider: Tonia Ghent Other Clinician: Referring Provider: Tonia Ghent Treating Provider/Extender: Linwood Dibbles, HOYT Weeks in Treatment: 0 Information Obtained from: Patient Chief Complaint Bilateral LE Ulcers Electronic Signature(s) Signed: 04/07/2019 2:05:22 PM By: Lenda Kelp PA-C Entered By: Lenda Kelp on 04/07/2019 14:05:21 Nichole Martin ([id number]) -------------------------------------------------------------------------------- HPI Details Patient Name: Nichole Martin Date of Service: 04/07/2019 1:15 PM Medical Record Number: [id number] Patient Account Number: [id number] Date of Birth/Sex: [birth year] (48 y.o. F) Treating RN: Arnette Norris Primary Care Provider: Tonia Ghent Other Clinician: Referring Provider: Tonia Ghent Treating Provider/Extender: Linwood Dibbles, HOYT Weeks in Treatment: 0 History of Present Illness HPI Description: 04/07/19 patient presents today for initial evaluation our clinic concerning issues that she's been having with her bilateral lower extremities. She does have some lower extremity edema evidence of venous insufficiency as well. She tells me that the wounds in general seem to be doing somewhat better after she began to take some oral ampicillin [place] got from [place]. When she had the Keflex he unfortunately developed a rash and ended up having an issue with the rash on her gluteal and anal region. She has been putting the gentian violet over the rash in the gluteal region as well as over her lower extremities. She feels like this is helping to dry things out. Fortunately there is no evidence of  active infection at this time although she was in the hospital for cellulitis as well. The patient does state history of rheumatoid arthritis although she doesn't have a current doctor that she sees. No fevers, chills, nausea, or vomiting noted at this time. Electronic Signature(s) Signed: 04/09/2019 2:09:51 AM By: Lenda Kelp PA-C Entered By: Lenda Kelp on 04/09/2019 01:58:57 Nichole Martin ([id number]) -------------------------------------------------------------------------------- Physical Exam Details Patient Name: Nichole Martin Date of Service: 04/07/2019 1:15 PM Medical Record Number: [id number] Patient Account Number: [id number] Date of Birth/Sex: 1970/09/27 (48 y.o. F) Treating RN: Arnette Norris Primary Care Provider: Tonia Ghent Other Clinician: Referring Provider: Tonia Ghent Treating Provider/Extender: STONE III, HOYT Weeks in Treatment: 0 Constitutional patient is hypertensive.. pulse regular and within target range for patient.Marland Kitchen respirations regular, non-labored and within target range for patient.Marland Kitchen temperature within target range for patient.. Well-nourished and well-hydrated in no acute distress. Eyes conjunctiva clear no eyelid edema noted. pupils equal round and reactive to light and accommodation. Ears, Nose, Mouth, and Throat no gross abnormality of ear auricles or external auditory canals. normal hearing noted during conversation. mucus membranes moist. Respiratory normal breathing without difficulty. clear to auscultation bilaterally. Cardiovascular regular rate and rhythm with normal S1, S2. Gastrointestinal (GI) soft, non-tender, non-distended, +BS. no ventral hernia noted. Musculoskeletal normal gait and posture. no significant deformity or arthritic changes, no loss or range of motion, no clubbing. Psychiatric this patient is able to make decisions and demonstrates good insight into disease process. Alert and Oriented x 3.  pleasant and cooperative. Notes Upon inspection today patient's wound bed actually showed signs of excellent improvement compared to what she tells me it was a while back. Unfortunately she still has some open areas scattered over the bilateral lower extremities though again the seem to be drying out somewhat she tells me. No fevers, chills, nausea, or vomiting noted at this time.  The patient is seen today with the assistance of an interpreter Electronic Signature(s) Signed: 04/09/2019 2:09:51 AM By: Lenda KelpStone III, Hoyt PA-C Entered By: Lenda KelpStone III, Hoyt on 04/09/2019 02:01:52 Nichole ChinaFLORES Martin, Nichole Martin ([id number]) -------------------------------------------------------------------------------- Physician Orders Details Patient Name: Nichole ChinaFLORES Martin, Nichole Martin Date of Service: 04/07/2019 1:15 PM Medical Record Number: [id number] Patient Account Number: [id number] Date of Birth/Sex: 12/16/1970 (48 y.o. 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 - Cast protector or plastic bags sealed with tape. If wraps get wet, call office to schedule an appointment to have legs rewrapped. Wound #2 Left,Circumferential Lower Leg o May shower with protection. - Cast protector or plastic bags sealed with tape. If wraps get wet, call office to schedule an appointment to have legs rewrapped. Primary Wound Dressing Wound #1 Right,Medial  Lower Leg o Hydrafera Blue Ready Transfer - Apply TCA to legs Wound #2 Left,Circumferential Lower Leg o Hydrafera Blue Ready Transfer - Apply TCA to legs Secondary Dressing Wound #1 Right,Medial Lower Leg o ABD pad Wound #2 Left,Circumferential Lower Leg o ABD pad Follow-up Appointments Wound #1 Right,Medial Lower Leg o Return Appointment in 1 week. o Nurse Visit as needed - Return Thursday, November 6th for a Nurse visit to rewrap legs Wound #2 Left,Circumferential Lower Leg o Return Appointment in 1 week. o Nurse Visit as needed - Return Thursday, November 6th for a Nurse visit to rewrap legs Edema Control Wound #1 Right,Medial Lower Leg o 3 Layer Compression System - Bilateral - Unna to anchor Levada DyFLORES Martin, Nichole Martin ([id number]) Wound #2 Left,Circumferential Lower Leg o 3 Layer Compression System - Bilateral - Unna to anchor Off-Loading Wound #1 Right,Medial Lower Leg o Other: - Elevate legs whenever you sit Wound #2 Left,Circumferential Lower Leg o Other: - Elevate legs whenever you sit Patient Medications Allergies: indomethacin, cephalexin Notifications Medication Indication Start End hydrocortisone 04/07/2019 DOSE 0 - topical 2.5 % cream - cream topical applied to the gluteal and anal region where the rash is present 2 times a day as directed Electronic Signature(s) Signed: 04/10/2019 4:15:06 PM By: Arnette NorrisBiell, Kristina Signed: 04/10/2019 6:06:55 PM By: Lenda KelpStone III, Hoyt PA-C Previous Signature: 04/07/2019 2:27:46 PM Version By: Lenda KelpStone III, Hoyt PA-C Entered By: Arnette NorrisBiell, Kristina on 04/10/2019 11:37:07 Nichole ChinaFLORES Martin, Nichole Martin ([id number]) -------------------------------------------------------------------------------- Problem List Details Patient Name: Nichole ChinaFLORES Martin, Nichole Martin Date of Service: 04/07/2019 1:15 PM Medical Record Number: [id number] Patient Account Number: [id number] Date of Birth/Sex: 05/02/1971 (48 y.o. F) Treating RN: Arnette NorrisBiell, Kristina Primary  Care Provider: Tonia GhentBENDER, ABBY Other Clinician: Referring Provider: Tonia GhentBENDER, ABBY Treating Provider/Extender: Linwood DibblesSTONE III, HOYT Weeks in Treatment: 0 Active Problems ICD-10 Evaluated Encounter Code Description Active Date Today Diagnosis I87.2 Venous insufficiency (chronic) (peripheral) 04/07/2019 No Yes L97.812 Non-pressure chronic ulcer of other part of right lower leg 04/07/2019 No Yes with fat layer exposed L97.822 Non-pressure chronic ulcer of other part of left lower leg with 04/07/2019 No Yes fat layer exposed M06.89 Other specified rheumatoid arthritis, multiple sites 04/07/2019 No Yes Inactive Problems Resolved Problems Electronic Signature(s) Signed: 04/07/2019 2:05:05 PM By: Lenda KelpStone III, Hoyt PA-C Entered  By: Lenda Kelp on 04/07/2019 14:05:05 Nichole Martin ([id number]) -------------------------------------------------------------------------------- Progress Note Details Patient Name: Nichole Martin, Nichole Martin Date of Service: 04/07/2019 1:15 PM Medical Record Number: [id number] Patient Account Number: [id number] Date of Birth/Sex: 1970-09-25 (48 y.o. F) Treating RN: Arnette Norris Primary Care Provider: Tonia Ghent Other Clinician: Referring Provider: Tonia Ghent Treating Provider/Extender: Linwood Dibbles, HOYT Weeks in Treatment: 0 Subjective Chief Complaint Information obtained from Patient Bilateral LE Ulcers History of Present Illness (HPI) 04/07/19 patient presents today for initial evaluation our clinic concerning issues that she's been having with her bilateral lower extremities. She does have some lower extremity edema evidence of venous insufficiency as well. She tells me that the wounds in general seem to be doing somewhat better after she began to take some oral ampicillin that [place] got from [place]. When she had the Keflex he unfortunately developed a rash and ended up having an issue with the rash on her gluteal and anal region. She has been putting  the gentian violet over the rash in the gluteal region as well as over her lower extremities. She feels like this is helping to dry things out. Fortunately there is no evidence of active infection at this time although she was in the hospital for cellulitis as well. The patient does state history of rheumatoid arthritis although she doesn't have a current doctor that she sees. No fevers, chills, nausea, or vomiting noted at this time. Patient History Information obtained from Patient. Allergies indomethacin, cephalexin (Reaction: Hives) Family History Heart Disease - Mother,Father, Hypertension - Mother,Father, Lung Disease - Father, No family history of Cancer, Diabetes, Hereditary Spherocytosis, Kidney Disease, Seizures, Stroke, Thyroid Problems, Tuberculosis. Social History Never smoker, Marital Status - Single, Alcohol Use - Never, Drug Use - No History, Caffeine Use - Never. Medical History Respiratory Patient has history of Asthma Denies history of Aspiration, Chronic Obstructive Pulmonary Disease (COPD), Pneumothorax, Sleep Apnea, Tuberculosis Integumentary (Skin) Denies history of History of Burn, History of pressure wounds Musculoskeletal Patient has history of Rheumatoid Arthritis, Osteoarthritis Denies history of Gout, Osteomyelitis Neurologic Denies history of Dementia, Neuropathy, Quadriplegia, Paraplegia, Seizure Disorder Medical And Surgical History Notes Neurologic migraines Nichole Martin, Nichole Martin ([id number]) Review of Systems (ROS) Constitutional Symptoms (General Health) Denies complaints or symptoms of Fatigue, Fever, Chills, Marked Weight Change. Eyes Denies complaints or symptoms of Dry Eyes, Vision Changes, Glasses / Contacts. Ear/Nose/Mouth/Throat Denies complaints or symptoms of Difficult clearing ears, Sinusitis. Hematologic/Lymphatic Denies complaints or symptoms of Bleeding / Clotting Disorders, Human Immunodeficiency Virus. Respiratory Denies  complaints or symptoms of Chronic or frequent coughs, Shortness of Breath. Cardiovascular Denies complaints or symptoms of Chest pain, LE edema. Gastrointestinal Denies complaints or symptoms of Frequent diarrhea, Nausea, Vomiting. Endocrine Denies complaints or symptoms of Hepatitis, Thyroid disease, Polydypsia (Excessive Thirst). Genitourinary Denies complaints or symptoms of Kidney failure/ Dialysis, Incontinence/dribbling. Immunological Denies complaints or symptoms of Hives, Itching. Integumentary (Skin) Complains or has symptoms of Wounds. Denies complaints or symptoms of Bleeding or bruising tendency, Breakdown, Swelling. Musculoskeletal Denies complaints or symptoms of Muscle Pain, Muscle Weakness. Neurologic Denies complaints or symptoms of Numbness/parasthesias, Focal/Weakness. Psychiatric Denies complaints or symptoms of Anxiety, Claustrophobia. Objective Constitutional patient is hypertensive.. pulse regular and within target range for patient.Marland Kitchen respirations regular, non-labored and within target range for patient.Marland Kitchen temperature within target range for patient.. Well-nourished and well-hydrated in no acute distress. Vitals Time Taken: 1:35 PM, Height: 66 in, Source: Stated, Weight: 221 lbs, Source: Measured, BMI: 35.7, Temperature: 97.9 F, Pulse: 82 bpm, Respiratory Rate: 16 breaths/min,  Blood Pressure: 154/93 mmHg. Eyes conjunctiva clear no eyelid edema noted. pupils equal round and reactive to light and accommodation. Ears, Nose, Mouth, and Throat no gross abnormality of ear auricles or external auditory canals. normal hearing noted during conversation. mucus membranes moist. Respiratory normal breathing without difficulty. clear to auscultation bilaterally. Nichole Alwyn Ren, Nichole Martin ([id number]) Cardiovascular regular rate and rhythm with normal S1, S2. Gastrointestinal (GI) soft, non-tender, non-distended, +BS. no ventral hernia noted. Musculoskeletal normal gait  and posture. no significant deformity or arthritic changes, no loss or range of motion, no clubbing. Psychiatric this patient is able to make decisions and demonstrates good insight into disease process. Alert and Oriented x 3. pleasant and cooperative. General Notes: Upon inspection today patient's wound bed actually showed signs of excellent improvement compared to what she tells me it was a while back. Unfortunately she still has some open areas scattered over the bilateral lower extremities though again the seem to be drying out somewhat she tells me. No fevers, chills, nausea, or vomiting noted at this time. The patient is seen today with the assistance of an interpreter Integumentary (Hair, Skin) Wound #1 status is Open. Original cause of wound was Gradually Appeared. The wound is located on the Right,Medial Lower Leg. The wound measures 7.6cm length x 2.3cm width x 0.1cm depth; 13.729cm^2 area and 1.373cm^3 volume. There is Fat Layer (Subcutaneous Tissue) Exposed exposed. There is no tunneling or undermining noted. There is a medium amount of serous drainage noted. The wound margin is flat and intact. There is medium (34-66%) pink granulation within the wound bed. There is a medium (34-66%) amount of necrotic tissue within the wound bed including Adherent Slough. Wound #2 status is Open. Original cause of wound was Gradually Appeared. The wound is located on the Left,Circumferential Lower Leg. The wound measures 22cm length x 28cm width x 0.1cm depth; 483.805cm^2 area and 48.381cm^3 volume. There is Fat Layer (Subcutaneous Tissue) Exposed exposed. There is no tunneling or undermining noted. There is a medium amount of serous drainage noted. The wound margin is flat and intact. There is medium (34-66%) pink granulation within the wound bed. There is a medium (34-66%) amount of necrotic tissue within the wound bed including Adherent Slough. Assessment Active Problems ICD-10 Venous  insufficiency (chronic) (peripheral) Non-pressure chronic ulcer of other part of right lower leg with fat layer exposed Non-pressure chronic ulcer of other part of left lower leg with fat layer exposed Other specified rheumatoid arthritis, multiple sites Plan Wound Cleansing: Wound #1 Right,Medial Lower Leg: May shower with protection. - Cast protector or plastic bags sealed with tape. If wraps get wet, call office to schedule an appointment to have legs rewrapped. Wound #2 Left,Circumferential Lower Leg: Nichole Martin, Nichole Martin ([id number]) May shower with protection. - Cast protector or plastic bags sealed with tape. If wraps get wet, call office to schedule an appointment to have legs rewrapped. Primary Wound Dressing: Wound #1 Right,Medial Lower Leg: Hydrafera Blue Ready Transfer - Apply TCA to legs Wound #2 Left,Circumferential Lower Leg: Hydrafera Blue Ready Transfer - Apply TCA to legs Secondary Dressing: Wound #1 Right,Medial Lower Leg: ABD pad Wound #2 Left,Circumferential Lower Leg: ABD pad Follow-up Appointments: Wound #1 Right,Medial Lower Leg: Return Appointment in 1 week. Nurse Visit as needed - Return Thursday, November 6th for a Nurse visit to rewrap legs Wound #2 Left,Circumferential Lower Leg: Return Appointment in 1 week. Nurse Visit as needed - Return Thursday, November 6th for a Nurse visit to rewrap legs Edema Control: Wound #1 Right,Medial  Lower Leg: 3 Layer Compression System - Bilateral Unna Boots Bilaterally Wound #2 Left,Circumferential Lower Leg: 3 Layer Compression System - Bilateral Unna Boots Bilaterally Off-Loading: Wound #1 Right,Medial Lower Leg: Other: - Elevate legs whenever you sit Wound #2 Left,Circumferential Lower Leg: Other: - Elevate legs whenever you sit The following medication(s) was prescribed: hydrocortisone topical 2.5 % cream 0 cream topical applied to the gluteal and anal region where the rash is present 2 times a day as  directed starting 04/07/2019 1. At this point I'm gonna go ahead and recommend that we utilize the [employer] for the lower extremities bilaterally I think this will be beneficial for her and again it does utilize the gentian violet which she seems to feel like his help with her as it is anyway. 2. With regards lower extremity swelling and get initiate treatment with three layer compression wrap which I'm hopeful will be helpful for her as well. 3. At this time with regard anabiotic someone had we continue taking ampicillin which she obtained again from a [place] where she was able to purchase this over-the-counter. Overall it seems to have helped and she did not have the allergic reaction that she experienced with the complex. 4. With regard to the ration the gluteal and anus region I'm gonna suggest I do queries on topical cream 2.5% which I did send into the pharmacy she's tried over-the-counter and that was not sufficient. 5. I do recommend patient elevate her legs as often and as much as possible in order to help with edema control as well. Please see above for specific wound care orders. We will see patient for re-evaluation in 1 week(s) here in the clinic. If anything worsens or changes patient will contact our office for additional recommendations. Nichole Martin, Nichole Martin ([id number]) Electronic Signature(s) Signed: 04/09/2019 2:09:51 AM By: Worthy Keeler PA-C Entered By: Worthy Keeler on 04/09/2019 02:04:01 Nichole Martin ([id number]) -------------------------------------------------------------------------------- ROS/PFSH Details Patient Name: Nichole Martin Date of Service: 04/07/2019 1:15 PM Medical Record Number: [id number] Patient Account Number: [id number] Date of Birth/Sex: [birth year] (48 y.o. F) Treating RN: Montey Hora Primary Care Provider: Rutherford Guys Other Clinician: Referring Provider: Rutherford Guys Treating Provider/Extender:  Melburn Hake, HOYT Weeks in Treatment: 0 Information Obtained From Patient Constitutional Symptoms (General Health) Complaints and Symptoms: Negative for: Fatigue; Fever; Chills; Marked Weight Change Eyes Complaints and Symptoms: Negative for: Dry Eyes; Vision Changes; Glasses / Contacts Ear/Nose/Mouth/Throat Complaints and Symptoms: Negative for: Difficult clearing ears; Sinusitis Hematologic/Lymphatic Complaints and Symptoms: Negative for: Bleeding / Clotting Disorders; Human Immunodeficiency Virus Respiratory Complaints and Symptoms: Negative for: Chronic or frequent coughs; Shortness of Breath Medical History: Positive for: Asthma Negative for: Aspiration; Chronic Obstructive Pulmonary Disease (COPD); Pneumothorax; Sleep Apnea; Tuberculosis Cardiovascular Complaints and Symptoms: Negative for: Chest pain; LE edema Gastrointestinal Complaints and Symptoms: Negative for: Frequent diarrhea; Nausea; Vomiting Endocrine Complaints and Symptoms: Negative for: Hepatitis; Thyroid disease; Polydypsia (Excessive Thirst) Genitourinary Nichole Martin, Nichole Martin ([id number]) Complaints and Symptoms: Negative for: Kidney failure/ Dialysis; Incontinence/dribbling Immunological Complaints and Symptoms: Negative for: Hives; Itching Integumentary (Skin) Complaints and Symptoms: Positive for: Wounds Negative for: Bleeding or bruising tendency; Breakdown; Swelling Medical History: Negative for: History of Burn; History of pressure wounds Musculoskeletal Complaints and Symptoms: Negative for: Muscle Pain; Muscle Weakness Medical History: Positive for: Rheumatoid Arthritis; Osteoarthritis Negative for: Gout; Osteomyelitis Neurologic Complaints and Symptoms: Negative for: Numbness/parasthesias; Focal/Weakness Medical History: Negative for: Dementia; Neuropathy; Quadriplegia; Paraplegia; Seizure Disorder Past Medical History Notes: migraines Psychiatric Complaints and  Symptoms:  Negative for: Anxiety; Claustrophobia Oncologic Immunizations Pneumococcal Vaccine: Received Pneumococcal Vaccination: No Immunization Notes: up to date Implantable Devices None Family and Social History Cancer: No; Diabetes: No; Heart Disease: Yes - Mother,Father; Hereditary Spherocytosis: No; Hypertension: Yes - Mother,Father; Kidney Disease: No; Lung Disease: Yes - Father; Seizures: No; Stroke: No; Thyroid Problems: No; Tuberculosis: No; Never smoker; Marital Status - Single; Alcohol Use: Never; Drug Use: No History; Caffeine Use: Never; Financial Concerns: No; Food, Clothing or Shelter Needs: No; Support System Lacking: No; Transportation Concerns: No Nichole ChinaFLORES Martin, Nichole Martin ([id number]) Electronic Signature(s) Signed: 04/07/2019 3:46:08 PM By: Curtis Sitesorthy, Joanna Signed: 04/09/2019 2:09:51 AM By: Lenda KelpStone III, Hoyt PA-C Entered By: Curtis Sitesorthy, Joanna on 04/07/2019 13:46:28 Nichole ChinaFLORES Martin, Nichole Martin ([id number]) -------------------------------------------------------------------------------- SuperBill Details Patient Name: Nichole ChinaFLORES Martin, Nichole Martin Date of Service: 04/07/2019 Medical Record Number: [id number] Patient Account Number: [id number] Date of Birth/Sex: 03/24/1971 (48 y.o. F) Treating RN: Arnette NorrisBiell, Kristina Primary Care Provider: Tonia GhentBENDER, ABBY Other Clinician: Referring Provider: Tonia GhentBENDER, ABBY Treating Provider/Extender: Linwood DibblesSTONE III, HOYT Weeks in Treatment: 0 Diagnosis Coding ICD-10 Codes Code Description I87.2 Venous insufficiency (chronic) (peripheral) L97.812 Non-pressure chronic ulcer of other part of right lower leg with fat layer exposed L97.822 Non-pressure chronic ulcer of other part of left lower leg with fat layer exposed M06.89 Other specified rheumatoid arthritis, multiple sites Facility Procedures CPT4 Code: [id number] Description: 99214 - WOUND CARE VISIT-LEV 4 EST PT Modifier: Quantity: 1 Physician Procedures CPT4 Code Description: [id number] - WC PHYS LEVEL 4  - NEW PT ICD-10 Diagnosis Description I87.2 Venous insufficiency (chronic) (peripheral) L97.812 Non-pressure chronic ulcer of other part of right lower leg wit L97.822 Non-pressure chronic ulcer of  other part of left lower leg with M06.89 Other specified rheumatoid arthritis, multiple sites Modifier: h fat layer expo fat layer expos Quantity: 1 sed ed Electronic Signature(s) Signed: 04/09/2019 2:09:51 AM By: Lenda KelpStone III, Hoyt PA-C Entered By: Lenda KelpStone III, Hoyt on 04/09/2019 02:09:27

## 2019-04-10 ENCOUNTER — Other Ambulatory Visit: Payer: Self-pay

## 2019-04-10 NOTE — Progress Notes (Signed)
LUCIE, FRIEDLANDER ([id number]) Visit Report for 04/10/2019 Arrival Information Details Patient Name: Nichole Martin, Nichole Martin Date of Service: 04/10/2019 10:15 AM Medical Record Number: [id number] Patient Account Number: [id number] Date of Birth/Sex: 02/07/1971 (48 y.o. F) Treating RN: Cornell Barman Primary Care Kemyah Buser: Rutherford Guys Other Clinician: Referring Trason Shifflet: Rutherford Guys Treating Keland Peyton/Extender: Melburn Hake, HOYT Weeks in Treatment: 0 Visit Information History Since Last Visit Added or deleted any medications: No Patient Arrived: Ambulatory Any new allergies or adverse reactions: No Arrival Time: 11:15 Had a fall or experienced change in No Accompanied By: daughter activities of daily living that may affect Transfer Assistance: None risk of falls: Patient Identification Verified: Yes Signs or symptoms of abuse/neglect since last visito No Secondary Verification Process Completed: Yes Hospitalized since last visit: No Pain Present Now: No Electronic Signature(s) Signed: 04/10/2019 5:19:38 PM By: Gretta Cool, BSN, RN, CWS, Kim RN, BSN Entered By: Gretta Cool, BSN, RN, CWS, Kim on 04/10/2019 11:16:25 Nichole Martin ([id number]) -------------------------------------------------------------------------------- Encounter Discharge Information Details Patient Name: Nichole Martin Date of Service: 04/10/2019 10:15 AM Medical Record Number: [id number] Patient Account Number: [id number] Date of Birth/Sex: 20-Oct-1970 (48 y.o. F) Treating RN: Cornell Barman Primary Care Nicholle Falzon: Rutherford Guys Other Clinician: Referring Jariyah Hackley: Rutherford Guys Treating Mika Griffitts/Extender: Melburn Hake, HOYT Weeks in Treatment: 0 Encounter Discharge Information Items Discharge Condition: Stable Ambulatory Status: Ambulatory Discharge Destination: Home Transportation: Private Auto Accompanied By: self Schedule Follow-up Appointment: Yes Clinical Summary of Care: Electronic Signature(s) Signed:  04/10/2019 5:19:38 PM By: Gretta Cool, BSN, RN, CWS, Kim RN, BSN Entered By: Gretta Cool, BSN, RN, CWS, Kim on 04/10/2019 11:17:44 Nichole Martin ([id number]) -------------------------------------------------------------------------------- Wound Assessment Details Patient Name: Nichole Martin Date of Service: 04/10/2019 10:15 AM Medical Record Number: [id number] Patient Account Number: [id number] Date of Birth/Sex: [birth year] (48 y.o. F) Treating RN: Cornell Barman Primary Care Raquon Milledge: Rutherford Guys Other Clinician: Referring Idaly Verret: Rutherford Guys Treating Morry Veiga/Extender: Melburn Hake, HOYT Weeks in Treatment: 0 Wound Status Wound Number: 1 Primary Etiology: Venous Leg Ulcer Wound Location: Right, Medial Lower Leg Wound Status: Open Wounding Event: Gradually Appeared Date Acquired: 01/04/2019 Weeks Of Treatment: 0 Clustered Wound: No Wound Measurements Length: (cm) 7.6 Width: (cm) 2.3 Depth: (cm) 0.1 Area: (cm) 13.729 Volume: (cm) 1.373 % Reduction in Area: 0% % Reduction in Volume: 0% Wound Description Full Thickness Without Exposed Support Classification: Structures Treatment Notes Wound #1 (Right, Medial Lower Leg) Notes TCA to bilateral legs (surrounding and on wounds), ABD, 3-Layer bilaterally, unna to anchor Electronic Signature(s) Signed: 04/10/2019 5:19:38 PM By: Gretta Cool, BSN, RN, CWS, Kim RN, BSN Entered By: Gretta Cool, BSN, RN, CWS, Kim on 04/10/2019 11:16:58 Nichole Martin ([id number]) -------------------------------------------------------------------------------- Wound Assessment Details Patient Name: Nichole Martin Date of Service: 04/10/2019 10:15 AM Medical Record Number: [id number] Patient Account Number: [id number] Date of Birth/Sex: 1971/06/04 (48 y.o. F) Treating RN: Cornell Barman Primary Care Tulio Facundo: Rutherford Guys Other Clinician: Referring Raiya Stainback: Rutherford Guys Treating Addasyn Mcbreen/Extender: Melburn Hake, HOYT Weeks in Treatment: 0 Wound  Status Wound Number: 2 Primary Etiology: Venous Leg Ulcer Wound Location: Left, Circumferential Lower Leg Wound Status: Open Wounding Event: Gradually Appeared Date Acquired: 01/04/2019 Weeks Of Treatment: 0 Clustered Wound: No Wound Measurements Length: (cm) 15 Width: (cm) 15 Depth: (cm) 0.1 Area: (cm) 176.715 Volume: (cm) 17.671 % Reduction in Area: 63.5% % Reduction in Volume: 63.5% Wound Description Full Thickness Without Exposed Support Classification: Structures Treatment Notes Wound #2 (Left, Circumferential Lower Leg) Notes TCA to bilateral legs (surrounding and on wounds), ABD, 3-Layer bilaterally, unna to anchor Electronic Signature(s) Signed:  04/10/2019 5:19:38 PM By: Elliot Gurney, BSN, RN, CWS, Kim RN, BSN Entered By: Elliot Gurney, BSN, RN, CWS, Kim on 04/10/2019 11:16:59

## 2019-04-15 ENCOUNTER — Encounter: Payer: Self-pay | Admitting: Physician Assistant

## 2019-04-15 ENCOUNTER — Other Ambulatory Visit: Payer: Self-pay

## 2019-04-15 NOTE — Progress Notes (Addendum)
IDA, MILBRATH ([id number]) Visit Report for 04/15/2019 Chief Complaint Document Details Patient Name: Nichole Martin Date of Service: 04/15/2019 3:30 PM Medical Record Number: [id number] Patient Account Number: [id number] Date of Birth/Sex: December 12, 1970 (48 y.o. F) Treating RN: Curtis Sites Primary Care Provider: Tonia Ghent Other Clinician: Referring Provider: Tonia Ghent Treating Provider/Extender: Linwood Dibbles, HOYT Weeks in Treatment: 1 Information Obtained from: Patient Chief Complaint Bilateral LE Ulcers Electronic Signature(s) Signed: 04/15/2019 3:54:00 PM By: Lenda Kelp PA-C Entered By: Lenda Kelp on 04/15/2019 15:53:59 Nichole Martin ([id number]) -------------------------------------------------------------------------------- HPI Details Patient Name: Nichole Martin Date of Service: 04/15/2019 3:30 PM Medical Record Number: [id number] Patient Account Number: [id number] Date of Birth/Sex: [birth year] (48 y.o. F) Treating RN: Curtis Sites Primary Care Provider: Tonia Ghent Other Clinician: Referring Provider: Tonia Ghent Treating Provider/Extender: Linwood Dibbles, HOYT Weeks in Treatment: 1 History of Present Illness HPI Description: 04/07/19 patient presents today for initial evaluation our clinic concerning issues that she's been having with her bilateral lower extremities. She does have some lower extremity edema evidence of venous insufficiency as well. She tells me that the wounds in general seem to be doing somewhat better after she began to take some [place] When she had the Keflex he unfortunately developed a rash and ended up having an issue with the rash on her gluteal and anal region. She has been putting the gentian violet over the rash in the gluteal region as well as over her lower extremities. She feels like this is helping to dry things out. Fortunately there is no evidence of  active infection at this time although she was in the hospital for cellulitis as well. The patient does state history of rheumatoid arthritis although she doesn't have a current doctor that she sees. No fevers, chills, nausea, or vomiting noted at this time. 04/15/2019 upon evaluation today patient actually appears to be doing quite well with regard to her lower extremities. In fact the right lower extremity seems like it is completely healed which is great news. The left lower extremity though not healed appears to be doing much better as well which is also excellent news. Overall I am happy with the progress that is been made in the interim since last week. Unfortunately she still having some issues with her sacral region she states this itches terribly. Electronic Signature(s) Signed: 04/15/2019 5:04:46 PM By: Lenda Kelp PA-C Entered By: Lenda Kelp on 04/15/2019 17:04:46 Nichole Martin ([id number]) -------------------------------------------------------------------------------- Physical Exam Details Patient Name: Nichole Martin Date of Service: 04/15/2019 3:30 PM Medical Record Number: [id number] Patient Account Number: [id number] Date of Birth/Sex: 09-24-1970 (48 y.o. F) Treating RN: Curtis Sites Primary Care Provider: Tonia Ghent Other Clinician: Referring Provider: Tonia Ghent Treating Provider/Extender: STONE III, HOYT Weeks in Treatment: 1 Constitutional Well-nourished and well-hydrated in no acute distress. Respiratory normal breathing without difficulty. clear to auscultation bilaterally. Cardiovascular regular rate and rhythm with normal S1, S2. Psychiatric this patient is able to make decisions and demonstrates good insight into disease process. Alert and Oriented x 3. pleasant and cooperative. Notes Patient's wound bed currently again in regard to her right lower extremity seems to be completely healed I am happy in this regard on the left I  am not even sure there is anything still open at this location but again I cannot be 100% sure there is still some of the gentian violet over the surface of her skin that I cannot really pill off  to carefully. With that being said I am pleased overall with how things have progressed. Unfortunately she tells me the hydrocortisone has not helped too much with the gluteal/sacral region where she has a rash. She still has been using the gentian violet over this location. Electronic Signature(s) Signed: 04/15/2019 5:05:40 PM By: Lenda KelpStone III, Hoyt PA-C Entered By: Lenda KelpStone III, Hoyt on 04/15/2019 17:05:40 Nichole Martin ([id number]) -------------------------------------------------------------------------------- Physician Orders Details Patient Name: Nichole Martin Date of Service: 04/15/2019 3:30 PM Medical Record Number: [id number] Patient Account Number: [id number] Date of Birth/Sex: 12/30/1970 (48 y.o. F) Treating RN: Curtis Sitesorthy, Joanna Primary Care Provider: Tonia GhentBENDER, ABBY Other Clinician: Referring Provider: Tonia GhentBENDER, ABBY Treating Provider/Extender: Linwood DibblesSTONE III, HOYT Weeks in Treatment: 1 Verbal / Phone Orders: No Diagnosis Coding ICD-10 Coding Code Description I87.2 Venous insufficiency (chronic) (peripheral) L97.812 Non-pressure chronic ulcer of other part of right lower leg with fat layer exposed L97.822 Non-pressure chronic ulcer of other part of left lower leg with fat layer exposed L98.492 Non-pressure chronic ulcer of skin of other sites with fat layer exposed M06.89 Other specified rheumatoid arthritis, multiple sites Wound Cleansing Wound #2 Left,Circumferential Lower Leg o May shower with protection. - Cast protector or plastic bags sealed with tape. If wraps get wet, call office to schedule an appointment to have legs rewrapped. Wound #3 Midline Sacrum o Dial antibacterial soap, wash wounds, rinse and pat dry prior to dressing wounds o May Shower, gently pat wound  dry prior to applying new dressing. Primary Wound Dressing Wound #2 Left,Circumferential Lower Leg o Hydrafera Blue Ready Transfer - Apply TCA to legs Wound #3 Midline Sacrum o Other: - continue using the gentain violet and hydrocortisone and use an ABD pad on top to prevent moisture Secondary Dressing Wound #2 Left,Circumferential Lower Leg o ABD pad Follow-up Appointments Wound #2 Left,Circumferential Lower Leg o Return Appointment in 1 week. Wound #3 Midline Sacrum o Return Appointment in 1 week. Edema Control Wound #2 Left,Circumferential Lower Leg o 3 Layer Compression System - Bilateral - Unna to anchor Off-Loading Wound #2 Left,Circumferential Lower Leg Nichole Martin ([id number]) o Other: - Elevate legs whenever you sit Electronic Signature(s) Signed: 04/15/2019 5:03:01 PM By: Curtis Sitesorthy, Joanna Signed: 04/15/2019 5:47:19 PM By: Lenda KelpStone III, Hoyt PA-C Entered By: Curtis Sitesorthy, Joanna on 04/15/2019 16:19:32 Nichole Martin ([id number]) -------------------------------------------------------------------------------- Problem List Details Patient Name: Nichole Martin Date of Service: 04/15/2019 3:30 PM Medical Record Number: [id number] Patient Account Number: [id number] Date of Birth/Sex: 11/17/1970 (48 y.o. F) Treating RN: Curtis Sitesorthy, Joanna Primary Care Provider: Tonia GhentBENDER, ABBY Other Clinician: Referring Provider: Tonia GhentBENDER, ABBY Treating Provider/Extender: Linwood DibblesSTONE III, HOYT Weeks in Treatment: 1 Active Problems ICD-10 Evaluated Encounter Code Description Active Date Today Diagnosis I87.2 Venous insufficiency (chronic) (peripheral) 04/07/2019 No Yes L97.812 Non-pressure chronic ulcer of other part of right lower leg 04/07/2019 No Yes with fat layer exposed L97.822 Non-pressure chronic ulcer of other part of left lower leg with 04/07/2019 No Yes fat layer exposed L98.492 Non-pressure chronic ulcer of skin of other sites with fat layer 04/15/2019 No  Yes exposed M06.89 Other specified rheumatoid arthritis, multiple sites 04/07/2019 No Yes Inactive Problems Resolved Problems Electronic Signature(s) Signed: 04/15/2019 4:05:15 PM By: Lenda KelpStone III, Hoyt PA-C Previous Signature: 04/15/2019 3:53:53 PM Version By: Lenda KelpStone III, Hoyt PA-C Entered By: Lenda KelpStone III, Hoyt on 04/15/2019 16:05:14 Nichole Martin ([id number]) -------------------------------------------------------------------------------- Progress Note Details Patient Name: Nichole Martin Date of Service: 04/15/2019 3:30 PM Medical Record Number: [id number] Patient Account Number: [id number] Date of Birth/Sex: 09/30/1970 (48 y.o. F) Treating  RN: Curtis Sites Primary Care Provider: Tonia Ghent Other Clinician: Referring Provider: Tonia Ghent Treating Provider/Extender: Linwood Dibbles, HOYT Weeks in Treatment: 1 Subjective Chief Complaint Information obtained from Patient Bilateral LE Ulcers History of Present Illness (HPI) 04/07/19 patient presents today for initial evaluation our clinic concerning issues that she's been having with her bilateral lower extremities. She does have some lower extremity edema evidence of venous insufficiency as well. She tells me that the wounds in general seem to be doing somewhat better after she began to take some oral ampicillin [place] from [place]. When she had the Keflex he unfortunately developed a rash and ended up having an issue with the rash on her gluteal and anal region. She has been putting the gentian violet over the rash in the gluteal region as well as over her lower extremities. She feels like this is helping to dry things out. Fortunately there is no evidence of active infection at this time although she was in the hospital for cellulitis as well. The patient does state history of rheumatoid arthritis although she doesn't have a current doctor that she sees. No fevers, chills, nausea, or vomiting noted at this  time. 04/15/2019 upon evaluation today patient actually appears to be doing quite well with regard to her lower extremities. In fact the right lower extremity seems like it is completely healed which is great news. The left lower extremity though not healed appears to be doing much better as well which is also excellent news. Overall I am happy with the progress that is been made in the interim since last week. Unfortunately she still having some issues with her sacral region she states this itches terribly. Patient History Information obtained from Patient. Family History Heart Disease - Mother,Father, Hypertension - Mother,Father, Lung Disease - Father, No family history of Cancer, Diabetes, Hereditary Spherocytosis, Kidney Disease, Seizures, Stroke, Thyroid Problems, Tuberculosis. Social History Never smoker, Marital Status - Single, Alcohol Use - Never, Drug Use - No History, Caffeine Use - Never. Medical History Respiratory Patient has history of Asthma Denies history of Aspiration, Chronic Obstructive Pulmonary Disease (COPD), Pneumothorax, Sleep Apnea, Tuberculosis Integumentary (Skin) Denies history of History of Burn, History of pressure wounds Musculoskeletal Patient has history of Rheumatoid Arthritis, Osteoarthritis Denies history of Gout, Osteomyelitis Neurologic Denies history of Dementia, Neuropathy, Quadriplegia, Paraplegia, Seizure Disorder Medical And Surgical History Notes Neurologic migraines Nichole, Martin ([id number]) Review of Systems (ROS) Constitutional Symptoms (General Health) Denies complaints or symptoms of Fatigue, Fever, Chills, Marked Weight Change. Respiratory Denies complaints or symptoms of Chronic or frequent coughs, Shortness of Breath. Cardiovascular Denies complaints or symptoms of Chest pain, LE edema. Psychiatric Denies complaints or symptoms of Anxiety, Claustrophobia. Objective Constitutional Well-nourished and well-hydrated  in no acute distress. Vitals Time Taken: 3:40 PM, Height: 66 in, Weight: 221 lbs, BMI: 35.7, Temperature: 98.0 F, Pulse: 71 bpm, Respiratory Rate: 16 breaths/min, Blood Pressure: 157/79 mmHg. Respiratory normal breathing without difficulty. clear to auscultation bilaterally. Cardiovascular regular rate and rhythm with normal S1, S2. Psychiatric this patient is able to make decisions and demonstrates good insight into disease process. Alert and Oriented x 3. pleasant and cooperative. General Notes: Patient's wound bed currently again in regard to her right lower extremity seems to be completely healed I am happy in this regard on the left I am not even sure there is anything still open at this location but again I cannot be 100% sure there is still some of the gentian violet over the surface  of her skin that I cannot really pill off to carefully. With that being said I am pleased overall with how things have progressed. Unfortunately she tells me the hydrocortisone has not helped too much with the gluteal/sacral region where she has a rash. She still has been using the gentian violet over this location. Integumentary (Hair, Skin) Wound #1 status is Healed - Epithelialized. Original cause of wound was Gradually Appeared. The wound is located on the Right,Medial Lower Leg. The wound measures 0cm length x 0cm width x 0cm depth; 0cm^2 area and 0cm^3 volume. There is no tunneling or undermining noted. There is a none present amount of drainage noted. There is no granulation within the wound bed. There is no necrotic tissue within the wound bed. Wound #2 status is Open. Original cause of wound was Gradually Appeared. The wound is located on the Left,Circumferential Lower Leg. The wound measures 10cm length x 17cm width x 0.1cm depth; 133.518cm^2 area and 13.352cm^3 volume. There is Fat Layer (Subcutaneous Tissue) Exposed exposed. There is no tunneling or undermining noted. There is a medium amount  of serosanguineous drainage noted. There is large (67-100%) pink granulation within the wound bed. There is a small (1-33%) amount of necrotic tissue within the wound bed including Adherent Slough. Wound #3 status is Open. Original cause of wound was Gradually Appeared. The wound is located on the Midline Sacrum. The wound measures 13cm length x 1cm width x 0.1cm depth; 10.21cm^2 area and 1.021cm^3 volume. There is Fat Layer Nichole Martin, Nichole Martin ([id number]) (Subcutaneous Tissue) Exposed exposed. There is no tunneling or undermining noted. There is a medium amount of serous drainage noted. The wound margin is indistinct and nonvisible. There is medium (34-66%) pink granulation within the wound bed. There is a medium (34-66%) amount of necrotic tissue within the wound bed including Eschar and Adherent Slough. 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 There was a Three Layer Compression Therapy Procedure with a pre-treatment ABI of 1.2 by Curtis Sites, RN. Post procedure Diagnosis Wound #2: Same as Pre-Procedure Plan Wound Cleansing: Wound #2 Left,Circumferential Lower Leg: May shower with protection. - Cast protector or plastic bags sealed with tape. If wraps get wet, call office to schedule an appointment to have legs rewrapped. Wound #3 Midline Sacrum: Dial antibacterial soap, wash wounds, rinse and pat dry prior to dressing wounds May Shower, gently pat wound dry prior to applying new dressing. Primary Wound Dressing: Wound #2  Left,Circumferential Lower Leg: Hydrafera Blue Ready Transfer - Apply TCA to legs Wound #3 Midline Sacrum: Other: - continue using the gentain violet and hydrocortisone and use an ABD pad on top to prevent moisture Secondary Dressing: Wound #2 Left,Circumferential Lower Leg: ABD pad Follow-up Appointments: Wound #2 Left,Circumferential Lower Leg: Return Appointment in 1 week. Wound #3 Midline Sacrum: Return Appointment in 1 week. Edema Control: Nichole Martin, Nichole Martin ([id number]) Wound #2 Left,Circumferential Lower Leg: 3 Layer Compression System - Bilateral - Unna to anchor Off-Loading: Wound #2 Left,Circumferential Lower Leg: Other: - Elevate legs whenever you sit 1. I would recommend currently that we continue with the wound care measures for the  left lower extremity which includes the Hydrofera Blue dressing along with a 3 layer compression wrap. Patient will use her own compression stockings for the right lower extremity at this point. 2. Pulmonary recommend as well that we I am going to recommend as well that we go ahead and continue with the gentian violet along with the use of the hydrocortisone that I prescribed for her for the sacral/gluteal region to help with the rash at this location. 3. I do recommend the patient continue to wash the area as well as far as utilizing Dial antibacterial soap I think that is helpful for her as well. We will see patient back for reevaluation in 1 week here in the clinic. If anything worsens or changes patient will contact our office for additional recommendations. Electronic Signature(s) Signed: 04/15/2019 5:17:38 PM By: Worthy Keeler PA-C Entered By: Worthy Keeler on 04/15/2019 17:17:38 Nichole Martin ([id number]) -------------------------------------------------------------------------------- ROS/PFSH Details Patient Name: Nichole Martin Date of Service: 04/15/2019 3:30 PM Medical Record Number:  [id number] Patient Account Number: [id number] Date of Birth/Sex: 1971-05-19 (48 y.o. F) Treating RN: Montey Hora Primary Care Provider: Rutherford Guys Other Clinician: Referring Provider: Rutherford Guys Treating Provider/Extender: Melburn Hake, HOYT Weeks in Treatment: 1 Information Obtained From Patient Constitutional Symptoms (General Health) Complaints and Symptoms: Negative for: Fatigue; Fever; Chills; Marked Weight Change Respiratory Complaints and Symptoms: Negative for: Chronic or frequent coughs; Shortness of Breath Medical History: Positive for: Asthma Negative for: Aspiration; Chronic Obstructive Pulmonary Disease (COPD); Pneumothorax; Sleep Apnea; Tuberculosis Cardiovascular Complaints and Symptoms: Negative for: Chest pain; LE edema Psychiatric Complaints and Symptoms: Negative for: Anxiety; Claustrophobia Integumentary (Skin) Medical History: Negative for: History of Burn; History of pressure wounds Musculoskeletal Medical History: Positive for: Rheumatoid Arthritis; Osteoarthritis Negative for: Gout; Osteomyelitis Neurologic Medical History: Negative for: Dementia; Neuropathy; Quadriplegia; Paraplegia; Seizure Disorder Past Medical History Notes: migraines Immunizations Pneumococcal Vaccine: Received Pneumococcal Vaccination: No Immunization Notes: up to date Nichole Martin, Nichole Martin ([id number]) Implantable Devices None Family and Social History Cancer: No; Diabetes: No; Heart Disease: Yes - Mother,Father; Hereditary Spherocytosis: No; Hypertension: Yes - Mother,Father; Kidney Disease: No; Lung Disease: Yes - Father; Seizures: No; Stroke: No; Thyroid Problems: No; Tuberculosis: No; Never smoker; Marital Status - Single; Alcohol Use: Never; Drug Use: No History; Caffeine Use: Never; Financial Concerns: No; Food, Clothing or Shelter Needs: No; Support System Lacking: No; Transportation Concerns: No Physician Affirmation I have reviewed and agree with the above  information. Electronic Signature(s) Signed: 04/15/2019 5:47:19 PM By: Worthy Keeler PA-C Signed: 04/16/2019 5:04:33 PM By: Montey Hora Entered By: Worthy Keeler on 04/15/2019 17:05:[address], Helene Kelp ([id number]) -------------------------------------------------------------------------------- SuperBill Details Patient Name: Nichole Martin Date of Service: 04/15/2019 Medical Record Number: [id number] Patient Account Number: [id number] Date of Birth/Sex: 06/04/1971 (48 y.o. F) Treating RN: Montey Hora Primary Care Provider: Rutherford Guys Other Clinician: Referring Provider: Rutherford Guys Treating Provider/Extender: Melburn Hake, HOYT Weeks in Treatment: 1 Diagnosis Coding ICD-10 Codes Code Description I87.2 Venous insufficiency (chronic) (peripheral) L97.812 Non-pressure chronic ulcer of other part of right lower leg with fat layer exposed L97.822 Non-pressure chronic ulcer of other part of left lower leg with fat layer exposed L98.492 Non-pressure chronic ulcer of skin of other sites with fat layer exposed M06.89 Other specified rheumatoid arthritis, multiple sites Facility Procedures CPT4: Description Modifier Quantity Code [id number] BILATERAL: Application of multi-layer venous compression system; leg (below 1 knee), including ankle and foot. ICD-10 Diagnosis Description I87.2 Venous insufficiency (chronic) (peripheral) Physician Procedures CPT4 Code Description: [id number]  [id number] - WC PHYS LEVEL 4 - EST PT ICD-10 Diagnosis Description I87.2 Venous insufficiency (chronic) (peripheral) L97.812 Non-pressure chronic ulcer of other part of right lower leg wi L97.822 Non-pressure chronic ulcer of  other part of left lower leg wit L98.492 Non-pressure chronic ulcer of skin of other sites with fat lay Modifier: th fat layer expos h fat layer expose er exposed Quantity: 1 ed d Electronic Signature(s) Signed: 04/16/2019 11:41:57 AM By: Francie MassingKelly, Tia Previous Signature:  04/15/2019 5:17:53 PM Version By: Lenda KelpStone III, Hoyt PA-C Entered By: Francie MassingKelly, Tia on 04/16/2019 11:41:56

## 2019-04-17 NOTE — Progress Notes (Signed)
Nichole Martin, Nichole Martin ([id number]) Visit Report for 04/15/2019 Arrival Information Details Patient Name: Nichole Martin, Nichole Martin Date of Service: 04/15/2019 3:30 PM Medical Record Number: [id number] Patient Account Number: [id number] Date of Birth/Sex: 10/06/1970 (48 y.o. F) Treating RN: Curtis Sitesorthy, Joanna Primary Care Leif Loflin: Tonia GhentBENDER, ABBY Other Clinician: Referring Luwana Butrick: Tonia GhentBENDER, ABBY Treating Isaias Dowson/Extender: Linwood DibblesSTONE III, HOYT Weeks in Treatment: 1 Visit Information History Since Last Visit Added or deleted any medications: No Patient Arrived: Ambulatory Any new allergies or adverse reactions: No Arrival Time: 15:39 Had a fall or experienced change in No Accompanied By: daughter and activities of daily living that may affect interpreter risk of falls: Transfer Assistance: None Signs or symptoms of abuse/neglect since last visito No Patient Identification Verified: Yes Hospitalized since last visit: No Secondary Verification Process Yes Implantable device outside of the clinic excluding No Completed: cellular tissue based products placed in the center since last visit: Has Dressing in Place as Prescribed: Yes Pain Present Now: No Electronic Signature(s) Signed: 04/15/2019 4:25:36 PM By: Dayton MartesWallace, RCP,RRT,CHT, Sallie RCP, RRT, CHT Entered By: Dayton MartesWallace, RCP,RRT,CHT, Sallie on 04/15/2019 15:42:20 Levada DyFLORES Martin, Rosey BathERESA ([id number]) -------------------------------------------------------------------------------- Compression Therapy Details Patient Name: Nichole Martin, Nichole Martin Date of Service: 04/15/2019 3:30 PM Medical Record Number: [id number] Patient Account Number: [id number] Date of Birth/Sex: 06/03/1971 (48 y.o. F) Treating RN: Curtis Sitesorthy, Joanna Primary Care Erianna Jolly: Tonia GhentBENDER, ABBY Other Clinician: Referring Arrionna Serena: Tonia GhentBENDER, ABBY Treating Tanekia Ryans/Extender: Linwood DibblesSTONE III, HOYT Weeks in Treatment: 1 Compression Therapy Performed for Wound Assessment: Wound #2  Left,Circumferential Lower Leg Performed By: Clinician Curtis Sitesorthy, Joanna, RN Compression Type: Three Layer Pre Treatment ABI: 1.2 Post Procedure Diagnosis Same as Pre-procedure Electronic Signature(s) Signed: 04/15/2019 5:03:01 PM By: Curtis Sitesorthy, Joanna Entered By: Curtis Sitesorthy, Joanna on 04/15/2019 16:14:44 Nichole Martin, Nichole Martin ([id number]) -------------------------------------------------------------------------------- Lower Extremity Assessment Details Patient Name: Nichole Martin, Nichole Martin Date of Service: 04/15/2019 3:30 PM Medical Record Number: [id number] Patient Account Number: [id number] Date of Birth/Sex: 02/08/1971 (48 y.o. F) Treating RN: Rodell PernaScott, Dajea Primary Care Mellanie Bejarano: Tonia GhentBENDER, ABBY Other Clinician: Referring Onie Hayashi: Tonia GhentBENDER, ABBY Treating Carlos Heber/Extender: STONE III, HOYT Weeks in Treatment: 1 Edema Assessment Assessed: [Left: No] [Right: No] Edema: [Left: No] [Right: No] Vascular Assessment Pulses: Dorsalis Pedis Palpable: [Left:Yes] [Right:Yes] Electronic Signature(s) Signed: 04/17/2019 3:26:34 PM By: Rodell PernaScott, Dajea Entered By: Rodell PernaScott, Dajea on 04/15/2019 15:52:22 Nichole Martin, Nichole Martin ([id number]) -------------------------------------------------------------------------------- Multi Wound Chart Details Patient Name: Nichole Martin, Nichole Martin Date of Service: 04/15/2019 3:30 PM Medical Record Number: [id number] Patient Account Number: [id number] Date of Birth/Sex: 07/02/1970 (48 y.o. F) Treating RN: Curtis Sitesorthy, Joanna Primary Care Mare Ludtke: Tonia GhentBENDER, ABBY Other Clinician: Referring Ludene Stokke: Tonia GhentBENDER, ABBY Treating Jalin Erpelding/Extender: STONE III, HOYT Weeks in Treatment: 1 Vital Signs Height(in): 66 Pulse(bpm): 71 Weight(lbs): 221 Blood Pressure(mmHg): 157/79 Body Mass Index(BMI): 36 Temperature(F): 98.0 Respiratory Rate 16 (breaths/min): Photos: Wound Location: Right, Medial Lower Leg Left Lower Leg - Sacrum - Midline Circumferential Wounding Event: Gradually  Appeared Gradually Appeared Gradually Appeared Primary Etiology: Venous Leg Ulcer Venous Leg Ulcer Medication Related Comorbid History: Asthma, Rheumatoid Arthritis, Asthma, Rheumatoid Arthritis, Asthma, Rheumatoid Arthritis, Osteoarthritis Osteoarthritis Osteoarthritis Date Acquired: 01/04/2019 01/04/2019 03/17/2019 Weeks of Treatment: 1 1 0 Wound Status: Healed - Epithelialized Open Open Measurements L x W x D 0x0x0 10x17x0.1 13x1x0.1 (cm) Area (cm) : 0 133.518 10.21 Volume (cm) : 0 13.352 1.021 % Reduction in Area: 100.00% 72.40% N/A % Reduction in Volume: 100.00% 72.40% N/A Classification: Full Thickness Without Full Thickness Without Full Thickness Without Exposed Support Structures Exposed Support Structures Exposed Support Structures Exudate Amount: None Present Medium Medium Exudate Type: N/A Serosanguineous Serous Exudate  Color: N/A red, brown amber DANNIKA HILGEMAN, Nichole Martin ([id number]) Wound Margin: N/A N/A Indistinct, nonvisible Granulation Amount: None Present (0%) Large (67-100%) Medium (34-66%) Granulation Quality: N/A Pink Pink Necrotic Amount: None Present (0%) Small (1-33%) Medium (34-66%) Necrotic Tissue: N/A Adherent [place] Exposed Structures: Fascia: No Fat Layer (Subcutaneous Fat Layer (Subcutaneous Fat Layer (Subcutaneous Tissue) Exposed: Yes Tissue) Exposed: Yes Tissue) Exposed: No Fascia: No Fascia: No Tendon: No Tendon: No Tendon: No Muscle: No Muscle: No Muscle: No Joint: No Joint: No Joint: No Bone: No Bone: No Bone: No Epithelialization: Large (67-100%) N/A None Treatment Notes Electronic Signature(s) Signed: 04/15/2019 5:03:01 PM By: Montey Hora Entered By: Montey Hora on 04/15/2019 16:09:35 [name] ([id number]) -------------------------------------------------------------------------------- Multi-Disciplinary Care Plan Details Patient Name: [name] Date of Service: 04/15/2019  3:30 PM Medical Record Number: [id number] Patient Account Number: [id number] Date of Birth/Sex: [birth year] (48 y.o. F) Treating RN: Montey Hora Primary Care Nicholes Hibler: Rutherford Guys Other Clinician: Referring Senta Kantor: Rutherford Guys Treating Kahleb Mcclane/Extender: Melburn Hake, HOYT Weeks in Treatment: 1 Active Inactive Venous Leg Ulcer Nursing Diagnoses: Knowledge deficit related to disease process and management Goals: Patient will maintain optimal edema control Date Initiated: 04/07/2019 Target Resolution Date: 05/07/2019 Goal Status: Active Interventions: Assess peripheral edema status every visit. Compression as ordered Notes: Wound/Skin Impairment Nursing Diagnoses: Impaired tissue integrity Goals: Ulcer/skin breakdown will have a volume reduction of 30% by week 4 Date Initiated: 04/07/2019 Target Resolution Date: 05/07/2019 Goal Status: Active Interventions: Assess patient/caregiver ability to obtain necessary supplies Assess patient/caregiver ability to perform ulcer/skin care regimen upon admission and as needed Assess ulceration(s) every visit Treatment Activities: Skin care regimen initiated : 04/07/2019 Notes: Electronic Signature(s) Signed: 04/15/2019 5:03:01 PM By: Montey Hora Entered By: Montey Hora on 04/15/2019 16:09:26 [name] ([id number]) -------------------------------------------------------------------------------- Pain Assessment Details Patient Name: [name] Date of Service: 04/15/2019 3:30 PM Medical Record Number: [id number] Patient Account Number: [id number] Date of Birth/Sex: [birth year] (48 y.o. F) Treating RN: Montey Hora Primary Care Shaneil Yazdi: Rutherford Guys Other Clinician: Referring Marisela Line: Rutherford Guys Treating Chason Mciver/Extender: Melburn Hake, HOYT Weeks in Treatment: 1 Active Problems Location of Pain Severity and Description of Pain Patient Has Paino No Site Locations Pain Management and Medication Current  Pain Management: Electronic Signature(s) Signed: 04/15/2019 4:25:36 PM By: Lorine Bears RCP, RRT, CHT Signed: 04/15/2019 5:03:01 PM By: Montey Hora Entered By: Lorine Bears on 04/15/2019 15:42:27 [address], Helene Kelp ([id number]) -------------------------------------------------------------------------------- Wound Assessment Details Patient Name: [name] Date of Service: 04/15/2019 3:30 PM Medical Record Number: [id number] Patient Account Number: [id number] Date of Birth/Sex: June 23, 1970 (48 y.o. F) Treating RN: Montey Hora Primary Care Aleshia Cartelli: Rutherford Guys Other Clinician: Referring Maki Hege: Rutherford Guys Treating Satine Hausner/Extender: STONE III, HOYT Weeks in Treatment: 1 Wound Status Wound Number: 1 Primary Etiology: Venous Leg Ulcer Wound Location: Right, Medial Lower Leg Wound Status: Healed - Epithelialized Wounding Event: Gradually Appeared Comorbid Asthma, Rheumatoid Arthritis, History: Osteoarthritis Date Acquired: 01/04/2019 Weeks Of Treatment: 1 Clustered Wound: No Photos Wound Measurements Length: (cm) 0 % Red Width: (cm) 0 % Red Depth: (cm) 0 Epith Area: (cm) 0 Tunn Volume: (cm) 0 Unde uction in Area: 100% uction in Volume: 100% elialization: Large (67-100%) eling: No rmining: No Wound Description Full Thickness Without Exposed Support Classification: Structures Exudate None Present Amount: Wound Bed Granulation Amount: None Present (0%) Exposed Structure Necrotic Amount: None Present (0%) Fascia Exposed: No Fat Layer (Subcutaneous Tissue) Exposed: No Tendon Exposed: No Muscle Exposed: No Joint Exposed: No Bone Exposed: No Electronic  Signature(s) Signed: 04/15/2019 5:03:01 PM By: Verl Blalock, Nichole Martin ([id number]) Entered By: Curtis Sites on 04/15/2019 16:07:19 Nichole China  ([id number]) -------------------------------------------------------------------------------- Wound Assessment Details Patient Name: Nichole China Date of Service: 04/15/2019 3:30 PM Medical Record Number: [id number] Patient Account Number: [id number] Date of Birth/Sex: August 17, 1970 (48 y.o. F) Treating RN: Rodell Perna Primary Care Zionah Criswell: Tonia Ghent Other Clinician: Referring Kaiulani Sitton: Tonia Ghent Treating Zaydin Billey/Extender: Linwood Dibbles, HOYT Weeks in Treatment: 1 Wound Status Wound Number: 2 Primary Etiology: Venous Leg Ulcer Wound Location: Left Lower Leg - Circumferential Wound Status: Open Wounding Event: Gradually Appeared Comorbid Asthma, Rheumatoid Arthritis, History: Osteoarthritis Date Acquired: 01/04/2019 Weeks Of Treatment: 1 Clustered Wound: No Photos Wound Measurements Length: (cm) 10 Width: (cm) 17 Depth: (cm) 0.1 Area: (cm) 133.518 Volume: (cm) 13.352 % Reduction in Area: 72.4% % Reduction in Volume: 72.4% Tunneling: No Undermining: No Wound Description Full Thickness Without Exposed Support Foul Classification: Structures Sloug Exudate Medium Amount: Exudate Type: Serosanguineous Exudate Color: red, brown Odor After Cleansing: No h/Fibrino Yes Wound Bed Granulation Amount: Large (67-100%) Exposed Structure Granulation Quality: Pink Fascia Exposed: No Necrotic Amount: Small (1-33%) Fat Layer (Subcutaneous Tissue) Exposed: Yes Necrotic Quality: Adherent Slough Tendon Exposed: No Muscle Exposed: No Joint Exposed: No Bone Exposed: No Electronic Signature(s) TAYNA, SMETHURST ([id number]) Signed: 04/17/2019 3:26:34 PM By: Rodell Perna Entered By: Rodell Perna on 04/15/2019 15:51:54 Nichole China ([id number]) -------------------------------------------------------------------------------- Wound Assessment Details Patient Name: Nichole China Date of Service: 04/15/2019 3:30 PM Medical Record Number:  [id number] Patient Account Number: [id number] Date of Birth/Sex: 1971-01-10 (48 y.o. F) Treating RN: Rodell Perna Primary Care Navika Hoopes: Tonia Ghent Other Clinician: Referring Astou Lada: Tonia Ghent Treating Makaila Windle/Extender: STONE III, HOYT Weeks in Treatment: 1 Wound Status Wound Number: 3 Primary Etiology: Medication Related Wound Location: Sacrum - Midline Wound Status: Open Wounding Event: Gradually Appeared Comorbid Asthma, Rheumatoid Arthritis, History: Osteoarthritis Date Acquired: 03/17/2019 Weeks Of Treatment: 0 Clustered Wound: No Photos Wound Measurements Length: (cm) 13 % Reduct Width: (cm) 1 % Reduct Depth: (cm) 0.1 Epitheli Area: (cm) 10.21 Tunneli Volume: (cm) 1.021 Undermi ion in Area: ion in Volume: alization: None ng: No ning: No Wound Description Full Thickness Without Exposed Support Foul Odo Classification: Structures Slough/F Wound Margin: Indistinct, nonvisible Exudate Medium Amount: Exudate Type: Serous Exudate Color: amber r After Cleansing: No ibrino Yes Wound Bed Granulation Amount: Medium (34-66%) Exposed Structure Granulation Quality: Pink Fascia Exposed: No Necrotic Amount: Medium (34-66%) Fat Layer (Subcutaneous Tissue) Exposed: Yes Necrotic Quality: Eschar, Adherent Slough Tendon Exposed: No Muscle Exposed: No Joint Exposed: No Bone Exposed: No GIADA, SCHOPPE ([id number]) Electronic Signature(s) Signed: 04/17/2019 3:26:34 PM By: Rodell Perna Entered By: Rodell Perna on 04/15/2019 15:56:22 Nichole China ([id number]) -------------------------------------------------------------------------------- Vitals Details Patient Name: Nichole China Date of Service: 04/15/2019 3:30 PM Medical Record Number: [id number] Patient Account Number: [id number] Date of Birth/Sex: Nov 25, 1970 (48 y.o. F) Treating RN: Curtis Sites Primary Care Tekeisha Hakim: Tonia Ghent Other Clinician: Referring Jamaya Sleeth: Tonia Ghent Treating Justus Droke/Extender: Linwood Dibbles, HOYT Weeks in Treatment: 1 Vital Signs Time Taken: 15:40 Temperature (F): 98.0 Height (in): 66 Pulse (bpm): 71 Weight (lbs): 221 Respiratory Rate (breaths/min): 16 Body Mass Index (BMI): 35.7 Blood Pressure (mmHg): 157/79 Reference Range: 80 - 120 mg / dl Electronic Signature(s) Signed: 04/15/2019 4:25:36 PM By: Dayton Martes RCP, RRT, CHT Entered By: Dayton Martes on 04/15/2019 15:43:38

## 2019-04-22 ENCOUNTER — Other Ambulatory Visit: Payer: Self-pay

## 2019-04-22 ENCOUNTER — Encounter: Payer: Self-pay | Admitting: Physician Assistant

## 2019-04-22 NOTE — Progress Notes (Addendum)
Nichole, Martin ([id number]) Visit Report for 04/22/2019 Chief Complaint Document Details Patient Name: Nichole, Martin Date of Service: 04/22/2019 2:45 PM Medical Record Number: [id number] Patient Account Number: [id number] Date of Birth/Sex: 09-11-1970 (48 y.o. F) Treating RN: Curtis Sites Primary Care Provider: Tonia Ghent Other Clinician: Referring Provider: Tonia Ghent Treating Provider/Extender: Linwood Dibbles, HOYT Weeks in Treatment: 2 Information Obtained from: Patient Chief Complaint Bilateral LE Ulcers Electronic Signature(s) Signed: 04/22/2019 2:49:53 PM By: Lenda Kelp PA-C Entered By: Lenda Kelp on 04/22/2019 14:49:52 Nichole Martin ([id number]) -------------------------------------------------------------------------------- HPI Details Patient Name: Nichole Martin Date of Service: 04/22/2019 2:45 PM Medical Record Number: [id number] Patient Account Number: [id number] Date of Birth/Sex: 04-14-1971 (48 y.o. F) Treating RN: Curtis Sites Primary Care Provider: Tonia Ghent Other Clinician: Referring Provider: Tonia Ghent Treating Provider/Extender: Linwood Dibbles, HOYT Weeks in Treatment: 2 History of Present Illness HPI Description: 04/07/19 patient presents today for initial evaluation our clinic concerning issues that she's been having with her bilateral lower extremities. She does have some lower extremity edema evidence of venous insufficiency as well. She tells me that the wounds in general seem to be doing somewhat better after she began to take some [place] When she had the Keflex he unfortunately developed a rash and ended up having an issue with the rash on her gluteal and anal region. She has been putting the gentian violet over the rash in the gluteal region as well as over her lower extremities. She feels like this is helping to dry things out. Fortunately there is no evidence of  active infection at this time although she was in the hospital for cellulitis as well. The patient does state history of rheumatoid arthritis although she doesn't have a current doctor that she sees. No fevers, chills, nausea, or vomiting noted at this time. 04/15/2019 upon evaluation today patient actually appears to be doing quite well with regard to her lower extremities. In fact the right lower extremity seems like it is completely healed which is great news. The left lower extremity though not healed appears to be doing much better as well which is also excellent news. Overall I am happy with the progress that is been made in the interim since last week. Unfortunately she still having some issues with her sacral region she states this itches terribly. 04/22/2019 upon evaluation today patient appears to be doing better in regard to her lower extremity ulcers at this time which is good news. Fortunately there is no signs of active infection at this time which is also good news.She did see her primary care provider as well with regard to the rash in the sacral/gluteal region in general. Subsequently it sounds like they did do a culture and based on the acyclovir that was prescribed I would assume this was probably a viral culture. Again this is the first day I really been able to see the wound well and it sounds like that they were thinking is along the same lines I was prior to hearing that she was placed on the acyclovir which is that this could be a herpes type outbreak. It is definitely painful enough and possible given the location. Electronic Signature(s) Signed: 04/22/2019 5:23:24 PM By: Lenda Kelp PA-C Entered By: Lenda Kelp on 04/22/2019 17:23:24 Nichole Martin ([id number]) -------------------------------------------------------------------------------- Physical Exam Details Patient Name: Nichole Martin Date of Service: 04/22/2019 2:45 PM Medical Record  Number: [id number] Patient Account Number: [id number] Date of  Birth/Sex: 04-22-1971 (48 y.o. F) Treating RN: Curtis Sites Primary Care Provider: Tonia Ghent Other Clinician: Referring Provider: Tonia Ghent Treating Provider/Extender: STONE III, HOYT Weeks in Treatment: 2 Constitutional Well-nourished and well-hydrated in no acute distress. Respiratory normal breathing without difficulty. clear to auscultation bilaterally. Cardiovascular regular rate and rhythm with normal S1, S2. Psychiatric this patient is able to make decisions and demonstrates good insight into disease process. Alert and Oriented x 3. pleasant and cooperative. Notes Upon inspection today patient's wound bed showed signs of healing well in regard to her lower extremities though she still has pain this is not as significant as it was previous. Overall I feel like the [facility] is doing great for this. With regard to the midline sacral region and extending out to the gluteal region to some degree the rash appears to be more ulcerated at this time and it is very possible that this could be something viral in nature such as a herpes type outbreak based on the symptomology as well as the overall appearance today and again this is the first day that I been able to really see the rash as she has been putting the gentian violet on the area which made it very difficult to be able to see what exactly was going on. Electronic Signature(s) Signed: 04/22/2019 5:24:18 PM By: Lenda Kelp PA-C Entered By: Lenda Kelp on 04/22/2019 17:24:18 Nichole Martin ([id number]) -------------------------------------------------------------------------------- Physician Orders Details Patient Name: Nichole Martin Date of Service: 04/22/2019 2:45 PM Medical Record Number: [id number] Patient Account Number: [id number] Date of Birth/Sex: 1970/08/08 (48 y.o. 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 - Cast protector or plastic bags sealed with tape. If wraps get wet, call office to schedule an appointment to have legs rewrapped. Wound #3 Midline Sacrum o Dial antibacterial soap, wash wounds, rinse and pat dry prior to dressing wounds o May Shower, gently pat wound dry prior to applying new dressing. Primary Wound Dressing Wound #2 Left,Circumferential Lower Leg o Silver Alginate Wound #3 Midline Sacrum o Silver Alginate - cover with abd pad Secondary Dressing Wound #2 Left,Circumferential Lower Leg o ABD pad Dressing Change Frequency Wound #2 Left,Circumferential Lower Leg o Change dressing every week Wound #3 Midline Sacrum o Change dressing every day. Follow-up Appointments Wound #2 Left,Circumferential Lower Leg o Return Appointment in 1 week. Wound #3 Midline Sacrum o Return Appointment in 1 week. Nichole Martin, Nichole Martin ([id number]) Edema Control Wound #2 Left,Circumferential Lower Leg o 3 Layer Compression System - Bilateral - Unna to anchor Off-Loading Wound #2 Left,Circumferential Lower Leg o Other: - Elevate legs whenever you sit Electronic Signature(s) Signed: 04/22/2019 4:34:50 PM By: Curtis Sites Signed: 04/22/2019 6:03:25 PM By: Lenda Kelp PA-C Entered By: Curtis Sites  on 04/22/2019 15:35:56 Nichole Martin ([id number]) -------------------------------------------------------------------------------- Problem List Details Patient Name: Nichole Martin Date of Service: 04/22/2019 2:45 PM Medical Record Number: [id number] Patient Account Number: [id number] Date of Birth/Sex:  December 29, 1970 (48 y.o. F) Treating RN: Montey Hora Primary Care Provider: Rutherford Guys Other Clinician: Referring Provider: Rutherford Guys Treating Provider/Extender: Melburn Hake, HOYT Weeks in Treatment: 2 Active Problems ICD-10 Evaluated Encounter Code Description Active Date Today Diagnosis I87.2 Venous insufficiency (chronic) (peripheral) 04/07/2019 No Yes L97.812 Non-pressure chronic ulcer of other part of right lower leg 04/07/2019 No Yes with fat layer exposed L97.822 Non-pressure chronic ulcer of other part of left lower leg with 04/07/2019 No Yes fat layer exposed L98.492 Non-pressure chronic ulcer of skin of other sites with fat layer 04/15/2019 No Yes exposed M06.89 Other specified rheumatoid arthritis, multiple sites 04/07/2019 No Yes Inactive Problems Resolved Problems Electronic Signature(s) Signed: 04/22/2019 2:49:47 PM By: Worthy Keeler PA-C Entered By: Worthy Keeler on 04/22/2019 14:49:46 Nichole Martin ([id number]) -------------------------------------------------------------------------------- Progress Note Details Patient Name: Nichole Martin Date of Service: 04/22/2019 2:45 PM Medical Record Number: [id number] Patient Account Number: [id number] Date of Birth/Sex: 01-Nov-1970 (48 y.o. F) Treating RN: Montey Hora Primary Care Provider: Rutherford Guys Other Clinician: Referring Provider: Rutherford Guys Treating Provider/Extender: Melburn Hake, HOYT Weeks in Treatment: 2 Subjective Chief Complaint Information obtained from Patient Bilateral LE Ulcers History of Present Illness (HPI) 04/07/19 patient presents today for initial  evaluation our clinic concerning issues that she's been having with her bilateral lower extremities. She does have some lower extremity edema evidence of venous insufficiency as well. She tells me that the wounds in general seem to be doing somewhat better after she began to take some oral ampicillin [place] from [place]. When she had the Keflex he unfortunately developed a rash and ended up having an issue with the rash on her gluteal and anal region. She has been putting the gentian violet over the rash in the gluteal region as well as over her lower extremities. She feels like this is helping to dry things out. Fortunately there is no evidence of active infection at this time although she was in the hospital for cellulitis as well. The patient does state history of rheumatoid arthritis although she doesn't have a current doctor that she sees. No fevers, chills, nausea, or vomiting noted at this time. 04/15/2019 upon evaluation today patient actually appears to be doing quite well with regard to her lower extremities. In fact the right lower extremity seems like it is completely healed which is great news. The left lower extremity though not healed appears to be doing much better as well which is also excellent news. Overall I am happy with the progress that is been made in the interim since last week. Unfortunately she still having some issues with her sacral region she states this itches terribly. 04/22/2019 upon evaluation today patient appears to be doing better in regard to her lower extremity ulcers at this time which is good news. Fortunately there is no signs of active infection at this time which is also good news.She did see her primary care provider as well with regard to the rash in the sacral/gluteal region in general. Subsequently it sounds like they did do a culture and based on the acyclovir that was prescribed I would assume this was probably a viral culture. Again this  is the first day I really been able to see the wound well and it sounds like that they were thinking is along the same lines I was prior to hearing that she was placed on the acyclovir which is that this could be a herpes type outbreak. It is definitely painful enough and  possible given the location. Patient History Information obtained from Patient. Family History Heart Disease - Mother,Father, Hypertension - Mother,Father, Lung Disease - Father, No family history of Cancer, Diabetes, Hereditary Spherocytosis, Kidney Disease, Seizures, Stroke, Thyroid Problems, Tuberculosis. Social History Never smoker, Marital Status - Single, Alcohol Use - Never, Drug Use - No History, Caffeine Use - Never. Medical History Respiratory Patient has history of Asthma Denies history of Aspiration, Chronic Obstructive Pulmonary Disease (COPD), Pneumothorax, Sleep Apnea, Tuberculosis Integumentary (Skin) Denies history of History of Burn, History of pressure wounds Musculoskeletal Nichole, Martin ([id number]) Patient has history of Rheumatoid Arthritis, Osteoarthritis Denies history of Gout, Osteomyelitis Neurologic Denies history of Dementia, Neuropathy, Quadriplegia, Paraplegia, Seizure Disorder Medical And Surgical History Notes Neurologic migraines Review of Systems (ROS) Constitutional Symptoms (General Health) Denies complaints or symptoms of Fatigue, Fever, Chills, Marked Weight Change. Respiratory Denies complaints or symptoms of Chronic or frequent coughs, Shortness of Breath. Cardiovascular Denies complaints or symptoms of Chest pain, LE edema. Psychiatric Denies complaints or symptoms of Anxiety, Claustrophobia. Objective Constitutional Well-nourished and well-hydrated in no acute distress. Vitals Time Taken: 2:55 PM, Height: 66 in, Weight: 221 lbs, BMI: 35.7, Temperature: 98.2 F, Pulse: 74 bpm, Respiratory Rate: 18 breaths/min, Blood Pressure: 136/80  mmHg. Respiratory normal breathing without difficulty. clear to auscultation bilaterally. Cardiovascular regular rate and rhythm with normal S1, S2. Psychiatric this patient is able to make decisions and demonstrates good insight into disease process. Alert and Oriented x 3. pleasant and cooperative. General Notes: Upon inspection today patient's wound bed showed signs of healing well in regard to her lower extremities though she still has pain this is not as significant as it was previous. Overall I feel like the [facility] is doing great for this. With regard to the midline sacral region and extending out to the gluteal region to some degree the rash appears to be more ulcerated at this time and it is very possible that this could be something viral in nature such as a herpes type outbreak based on the symptomology as well as the overall appearance today and again this is the first day that I been able to really see the rash as she has been putting the gentian violet on the area which made it very difficult to be able to see what exactly was going on. Integumentary (Hair, Skin) Wound #2 status is Open. Original cause of wound was Gradually Appeared. The wound is located on the Left,Circumferential Lower Leg. The wound measures 0.4cm length x 1.5cm width x 0.1cm depth; 0.471cm^2 area and 0.047cm^3 volume. There is Fat Layer (Subcutaneous Tissue) Exposed exposed. There is no tunneling or undermining LILYAN PRETE, Nichole Martin ([id number]) noted. There is a medium amount of serosanguineous drainage noted. There is large (67-100%) pink granulation within the wound bed. There is a small (1-33%) amount of necrotic tissue within the wound bed including Adherent Slough. Wound #3 status is Open. Original cause of wound was Gradually Appeared. The wound is located on the Midline Sacrum. The wound measures 11cm length x 2.5cm width x 0.1cm depth; 21.598cm^2 area and 2.16cm^3 volume. There is Fat  Layer (Subcutaneous Tissue) Exposed exposed. There is a medium amount of serous drainage noted. The wound margin is indistinct and nonvisible. There is medium (34-66%) pink granulation within the wound bed. There is a medium (34-66%) amount of necrotic tissue within the wound bed including Eschar and Adherent Slough. 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 There was a Three Layer Compression Therapy Procedure with a pre-treatment ABI of 1.2 by Curtis Sitesorthy, Joanna, RN. Post procedure Diagnosis Wound #2: Same as Pre-Procedure Plan Wound Cleansing: Wound #2 Left,Circumferential Lower Leg: May shower with protection. - Cast protector or plastic bags sealed with tape. If wraps get wet, call office to schedule an appointment to have legs rewrapped. Wound #3 Midline Sacrum: Dial antibacterial soap, wash wounds, rinse and pat dry prior to dressing wounds May Shower, gently pat wound dry prior to applying new dressing. Primary Wound Dressing: Wound #2 Left,Circumferential Lower Leg: Silver Alginate Wound #3 Midline Sacrum: Silver Alginate - cover with abd pad Secondary Dressing: Wound #2 Left,Circumferential Lower Leg: ABD pad Dressing Change FrequencyLevada Dy: Nichole Martin, Nichole Martin ([id number]) Wound #2 Left,Circumferential Lower Leg: Change dressing every week Wound #3 Midline Sacrum: Change dressing every day. Follow-up Appointments: Wound #2 Left,Circumferential Lower Leg: Return Appointment in 1 week. Wound #3 Midline Sacrum: Return  Appointment in 1 week. Edema Control: Wound #2 Left,Circumferential Lower Leg: 3 Layer Compression System - Bilateral - Unna to anchor Off-Loading: Wound #2 Left,Circumferential Lower Leg: Other: - Elevate legs whenever you sit 1. My suggestion at this time is good to be that we go ahead and initiate treatment with a silver alginate dressing for the sacral region in particular I think this is good to be better for her [facility] this potentially helping to keep the area dry. 2. I do recommend she continue to take the acyclovir that was prescribed by her primary care provider I think that seems to be appropriate based on what I am seeing at this point as well. We will await the results of the culture they obtained. 3. With regard to the lower extremity I do suggest that [facility] along with the 3 layer compression wrap which has done well for her. We will see patient back for reevaluation in 1 week here in the clinic. If anything worsens or changes patient will contact our office for additional recommendations. Electronic Signature(s) Signed: 04/22/2019 5:26:04 PM By: Lenda KelpStone III, Hoyt PA-C Entered By: Lenda KelpStone III, Hoyt on 04/22/2019 17:26:04 Nichole ChinaFLORES Martin, Nichole Martin ([id number]) -------------------------------------------------------------------------------- ROS/PFSH Details Patient Name: Nichole ChinaFLORES Martin, Nichole Martin Date of Service: 04/22/2019 2:45 PM Medical Record Number: [id number] Patient Account Number: [id number] Date of Birth/Sex: 12/13/1970 (48 y.o. F) Treating RN: Curtis Sitesorthy, Joanna Primary Care Provider: Tonia GhentBENDER, ABBY Other Clinician: Referring Provider: Tonia GhentBENDER, ABBY Treating Provider/Extender: Linwood DibblesSTONE III, HOYT Weeks in Treatment: 2 Information Obtained From Patient Constitutional Symptoms (General Health) Complaints and Symptoms: Negative for: Fatigue; Fever; Chills; Marked Weight Change Respiratory Complaints and Symptoms: Negative for: Chronic  or frequent coughs; Shortness of Breath Medical History: Positive for: Asthma Negative for: Aspiration; Chronic Obstructive Pulmonary Disease (COPD); Pneumothorax; Sleep Apnea; Tuberculosis Cardiovascular Complaints and Symptoms: Negative for: Chest pain; LE edema Psychiatric Complaints and Symptoms: Negative for: Anxiety; Claustrophobia Integumentary (Skin) Medical History: Negative for: History of Burn; History of pressure wounds Musculoskeletal Medical History: Positive for: Rheumatoid Arthritis; Osteoarthritis Negative for: Gout; Osteomyelitis Neurologic Medical History: Negative for: Dementia; Neuropathy; Quadriplegia; Paraplegia; Seizure Disorder Past Medical History Notes: migraines Immunizations Pneumococcal Vaccine: Received Pneumococcal Vaccination: No Immunization Notes: up to date  Nichole ChinaFLORES Martin, Nichole Martin ([id number]) Implantable Devices None Family and Social History Cancer: No; Diabetes: No; Heart Disease: Yes - Mother,Father; Hereditary Spherocytosis: No; Hypertension: Yes - Mother,Father; Kidney Disease: No; Lung Disease: Yes - Father; Seizures: No; Stroke: No; Thyroid Problems: No; Tuberculosis: No; Never smoker; Marital Status - Single; Alcohol Use: Never; Drug Use: No History; Caffeine Use: Never; Financial Concerns: No; Food, Clothing or Shelter Needs: No; Support System Lacking: No; Transportation Concerns: No Physician Affirmation I have reviewed and agree with the above information. Electronic Signature(s) Signed: 04/22/2019 6:03:25 PM By: Lenda KelpStone III, Hoyt PA-C Signed: 04/23/2019 4:46:03 PM By: Curtis Sitesorthy, Joanna Entered By: Lenda KelpStone III, Hoyt on 04/22/2019 17:23:38 Nichole ChinaFLORES Martin, Nichole Martin ([id number]) -------------------------------------------------------------------------------- SuperBill Details Patient Name: Nichole ChinaFLORES Martin, Nichole Martin Date of Service: 04/22/2019 Medical Record Number: [id number] Patient Account Number: [id number] Date of Birth/Sex:  10/19/1970 (48 y.o. F) Treating RN: Curtis Sitesorthy, Joanna Primary Care Provider: Tonia GhentBENDER, ABBY Other Clinician: Referring Provider: Tonia GhentBENDER, ABBY Treating Provider/Extender: Linwood DibblesSTONE III, HOYT Weeks in Treatment: 2 Diagnosis Coding ICD-10 Codes Code Description I87.2 Venous insufficiency (chronic) (peripheral) L97.812 Non-pressure chronic ulcer of other part of right lower leg with fat layer exposed L97.822 Non-pressure chronic ulcer of other part of left lower leg with fat layer exposed L98.492 Non-pressure chronic ulcer of skin of other sites with fat layer exposed M06.89 Other specified rheumatoid arthritis, multiple sites Facility Procedures CPT4 Code: [id number] Description: (Facility Use Only) 29581LT - APPLY MULTLAY COMPRS LWR LT LEG Modifier: Quantity: 1 Physician Procedures CPT4 Code Description: [id number] - WC PHYS LEVEL 4 - EST PT ICD-10 Diagnosis Description I87.2 Venous insufficiency (chronic) (peripheral) L97.812 Non-pressure chronic ulcer of other part of right lower leg wi L97.822 Non-pressure chronic ulcer of  other part of left lower leg wit L98.492 Non-pressure chronic ulcer of skin of other sites with fat lay Modifier: th fat layer expos h fat layer expose er exposed Quantity: 1 ed d Electronic Signature(s) Signed: 04/22/2019 5:26:21 PM By: Lenda KelpStone III, Hoyt PA-C Previous Signature: 04/22/2019 4:34:50 PM Version By: Curtis Sitesorthy, Joanna Entered By: Lenda KelpStone III, Hoyt on 04/22/2019 17:26:20

## 2019-04-22 NOTE — Progress Notes (Addendum)
Nichole, Martin ([id number]) Visit Report for 04/22/2019 Arrival Information Details Patient Name: Nichole Martin, Nichole Martin Date of Service: 04/22/2019 2:45 PM Medical Record Number: [id number] Patient Account Number: [id number] Date of Birth/Sex: 1971-02-04 (48 y.o. F) Treating RN: Curtis Sites Primary Care Brookley Spitler: Tonia Ghent Other Clinician: Referring Sharonne Ricketts: Tonia Ghent Treating Jhania Etherington/Extender: Linwood Dibbles, HOYT Weeks in Treatment: 2 Visit Information History Since Last Visit Added or deleted any medications: No Patient Arrived: Ambulatory Any new allergies or adverse reactions: No Arrival Time: 14:53 Had a fall or experienced change in No Accompanied By: interpreter activities of daily living that may affect Transfer Assistance: None risk of falls: Patient Identification Verified: Yes Signs or symptoms of abuse/neglect since last visito No Secondary Verification Process Completed: Yes Hospitalized since last visit: No Implantable device outside of the clinic excluding No cellular tissue based products placed in the center since last visit: Has Dressing in Place as Prescribed: Yes Pain Present Now: Yes Electronic Signature(s) Signed: 04/22/2019 3:46:15 PM By: Dayton Martes RCP, RRT, CHT Entered By: Dayton Martes on 04/22/2019 14:55:30 Levada Dy, Rosey Bath ([id number]) -------------------------------------------------------------------------------- Compression Therapy Details Patient Name: Nichole Martin Date of Service: 04/22/2019 2:45 PM Medical Record Number: [id number] Patient Account Number: [id number] Date of Birth/Sex: 1971-01-12 (48 y.o. F) Treating RN: Curtis Sites Primary Care Samrat Hayward: Tonia Ghent Other Clinician: Referring Ansley Mangiapane: Tonia Ghent Treating Kamile Fassler/Extender: Linwood Dibbles, HOYT Weeks in Treatment: 2 Compression Therapy Performed for Wound Assessment: Wound #2 Left,Circumferential Lower  Leg Performed By: Clinician Curtis Sites, RN Compression Type: Three Layer Pre Treatment ABI: 1.2 Post Procedure Diagnosis Same as Pre-procedure Electronic Signature(s) Signed: 04/22/2019 4:34:50 PM By: Curtis Sites Entered By: Curtis Sites on 04/22/2019 15:39:01 Nichole Martin ([id number]) -------------------------------------------------------------------------------- Encounter Discharge Information Details Patient Name: Nichole Martin Date of Service: 04/22/2019 2:45 PM Medical Record Number: [id number] Patient Account Number: [id number] Date of Birth/Sex: August 25, 1970 (48 y.o. F) Treating RN: Curtis Sites Primary Care Shaindel Sweeten: Tonia Ghent Other Clinician: Referring Labria Wos: Tonia Ghent Treating Arshia Spellman/Extender: Linwood Dibbles, HOYT Weeks in Treatment: 2 Encounter Discharge Information Items Discharge Condition: Stable Ambulatory Status: Ambulatory Discharge Destination: Home Transportation: Private Auto Accompanied By: interpreter Schedule Follow-up Appointment: Yes Clinical Summary of Care: Electronic Signature(s) Signed: 04/22/2019 4:34:50 PM By: Curtis Sites Entered By: Curtis Sites on 04/22/2019 15:40:43 Nichole Martin ([id number]) -------------------------------------------------------------------------------- Lower Extremity Assessment Details Patient Name: Nichole Martin Date of Service: 04/22/2019 2:45 PM Medical Record Number: [id number] Patient Account Number: [id number] Date of Birth/Sex: [birth year] (48 y.o. F) Treating RN: Rodell Perna Primary Care Kollin Udell: Tonia Ghent Other Clinician: Referring Dangela How: Tonia Ghent Treating Ananth Fiallos/Extender: STONE III, HOYT Weeks in Treatment: 2 Edema Assessment Assessed: [Left: No] [Right: No] Edema: [Left: N] [Right: o] Vascular Assessment Pulses: Dorsalis Pedis Palpable: [Left:Yes] Electronic Signature(s) Signed: 04/22/2019 3:54:34 PM By: Rodell Perna Entered By:  Rodell Perna on 04/22/2019 15:09:52 Nichole Martin ([id number]) -------------------------------------------------------------------------------- Multi Wound Chart Details Patient Name: Nichole Martin Date of Service: 04/22/2019 2:45 PM Medical Record Number: [id number] Patient Account Number: [id number] Date of Birth/Sex: 06/06/1970 (48 y.o. F) Treating RN: Curtis Sites Primary Care Mariadelcarmen Corella: Tonia Ghent Other Clinician: Referring Katrina Brosh: Tonia Ghent Treating Obelia Bonello/Extender: STONE III, HOYT Weeks in Treatment: 2 Vital Signs Height(in): 66 Pulse(bpm): 74 Weight(lbs): 221 Blood Pressure(mmHg): 136/80 Body Mass Index(BMI): 36 Temperature(F): 98.2 Respiratory Rate 18 (breaths/min): Photos: [N/A:N/A] Wound Location: Left Lower Leg - Sacrum - Midline N/A Circumferential Wounding Event: Gradually Appeared Gradually Appeared N/A Primary Etiology: Venous Leg Ulcer Medication Related N/A Comorbid History: Asthma, Rheumatoid Arthritis,  Asthma, Rheumatoid Arthritis, N/A Osteoarthritis Osteoarthritis Date Acquired: 01/04/2019 03/17/2019 N/A Weeks of Treatment: 2 1 N/A Wound Status: Open Open N/A Measurements L x W x D 0.4x1.5x0.1 11x2.5x0.1 N/A (cm) Area (cm) : 0.471 21.598 N/A Volume (cm) : 0.047 2.16 N/A % Reduction in Area: 99.90% -111.50% N/A % Reduction in Volume: 99.90% -111.60% N/A Classification: Full Thickness Without Full Thickness Without N/A Exposed Support Structures Exposed Support Structures Exudate Amount: Medium Medium N/A Exudate Type: Serosanguineous Serous N/A Exudate Color: red, brown amber N/A VANDA, WORSHAM ([id number]) Wound Margin: N/A Indistinct, nonvisible N/A Granulation Amount: Large (67-100%) Medium (34-66%) N/A Granulation Quality: Pink Pink N/A Necrotic Amount: Small (1-33%) Medium (34-66%) N/A Necrotic Tissue: Adherent Slough Eschar, Adherent Slough N/A Exposed Structures: Fat Layer (Subcutaneous Fat Layer  (Subcutaneous N/A Tissue) Exposed: Yes Tissue) Exposed: Yes Fascia: No Fascia: No Tendon: No Tendon: No Muscle: No Muscle: No Joint: No Joint: No Bone: No Bone: No Epithelialization: N/A None N/A Treatment Notes Electronic Signature(s) Signed: 04/22/2019 4:34:50 PM By: Curtis Sites Entered By: Curtis Sites on 04/22/2019 15:32:28 Nichole Martin ([id number]) -------------------------------------------------------------------------------- Multi-Disciplinary Care Plan Details Patient Name: Nichole Martin Date of Service: 04/22/2019 2:45 PM Medical Record Number: [id number] Patient Account Number: [id number] Date of Birth/Sex: [birth year] (48 y.o. F) Treating RN: Curtis Sites Primary Care Niya Behler: Tonia Ghent Other Clinician: Referring Matha Masse: Tonia Ghent Treating Nattaly Yebra/Extender: Linwood Dibbles, HOYT Weeks in Treatment: 2 Active Inactive Venous Leg Ulcer Nursing Diagnoses: Knowledge deficit related to disease process and management Goals: Patient will maintain optimal edema control Date Initiated: 04/07/2019 Target Resolution Date: 05/07/2019 Goal Status: Active Interventions: Assess peripheral edema status every visit. Compression as ordered Notes: Wound/Skin Impairment Nursing Diagnoses: Impaired tissue integrity Goals: Ulcer/skin breakdown will have a volume reduction of 30% by week 4 Date Initiated: 04/07/2019 Target Resolution Date: 05/07/2019 Goal Status: Active Interventions: Assess patient/caregiver ability to obtain necessary supplies Assess patient/caregiver ability to perform ulcer/skin care regimen upon admission and as needed Assess ulceration(s) every visit Treatment Activities: Skin care regimen initiated : 04/07/2019 Notes: Electronic Signature(s) Signed: 04/22/2019 4:34:50 PM By: Curtis Sites Entered By: Curtis Sites on 04/22/2019 15:32:21 Nichole Martin  ([id number]) -------------------------------------------------------------------------------- Pain Assessment Details Patient Name: Nichole Martin Date of Service: 04/22/2019 2:45 PM Medical Record Number: [id number] Patient Account Number: [id number] Date of Birth/Sex: May 09, 1971 (48 y.o. F) Treating RN: Curtis Sites Primary Care Zurri Rudden: Tonia Ghent Other Clinician: Referring Adaley Kiene: Tonia Ghent Treating Matilyn Fehrman/Extender: Linwood Dibbles, HOYT Weeks in Treatment: 2 Active Problems Location of Pain Severity and Description of Pain Patient Has Paino Yes Site Locations Rate the pain. Current Pain Level: 8 Pain Management and Medication Current Pain Management: Electronic Signature(s) Signed: 04/22/2019 3:46:15 PM By: Dayton Martes RCP, RRT, CHT Signed: 04/22/2019 4:34:50 PM By: Curtis Sites Entered By: Dayton Martes on 04/22/2019 14:55:40 Nichole Martin ([id number]) -------------------------------------------------------------------------------- Patient/Caregiver Education Details Patient Name: Nichole Martin Date of Service: 04/22/2019 2:45 PM Medical Record Number: [id number] Patient Account Number: [id number] Date of Birth/Gender: 1971/03/15 (48 y.o. F) Treating RN: Curtis Sites Primary Care Physician: Tonia Ghent Other Clinician: Referring Physician: Tonia Ghent Treating Physician/Extender: Skeet Simmer in Treatment: 2 Education Assessment Education Provided To: Patient Education Topics Provided Wound/Skin Impairment: Handouts: Other: wound care on bottom area Methods: Demonstration, Explain/Verbal Responses: State content correctly Electronic Signature(s) Signed: 04/22/2019 4:34:50 PM By: Curtis Sites Entered By: Curtis Sites on 04/22/2019 15:39:28 Levada Dy, Rosey Bath ([id number]) -------------------------------------------------------------------------------- Wound Assessment  Details Patient Name: Nichole Martin Date of Service: 04/22/2019 2:45  PM Medical Record Number: [id number] Patient Account Number: [id number] Date of Birth/Sex: April 22, 1971 (48 y.o. F) Treating RN: Army Melia Primary Care Cynthis Purington: Rutherford Guys Other Clinician: Referring Quinten Allerton: Rutherford Guys Treating Adriannah Steinkamp/Extender: Melburn Hake, HOYT Weeks in Treatment: 2 Wound Status Wound Number: 2 Primary Etiology: Venous Leg Ulcer Wound Location: Left Lower Leg - Circumferential Wound Status: Open Wounding Event: Gradually Appeared Comorbid Asthma, Rheumatoid Arthritis, History: Osteoarthritis Date Acquired: 01/04/2019 Weeks Of Treatment: 2 Clustered Wound: No Photos Wound Measurements Length: (cm) 0.4 Width: (cm) 1.5 Depth: (cm) 0.1 Area: (cm) 0.471 Volume: (cm) 0.047 % Reduction in Area: 99.9% % Reduction in Volume: 99.9% Tunneling: No Undermining: No Wound Description Full Thickness Without Exposed Support Foul Classification: Structures Sloug Exudate Medium Amount: Exudate Type: Serosanguineous Exudate Color: red, brown Odor After Cleansing: No h/Fibrino Yes Wound Bed Granulation Amount: Large (67-100%) Exposed Structure Granulation Quality: Pink Fascia Exposed: No Necrotic Amount: Small (1-33%) Fat Layer (Subcutaneous Tissue) Exposed: Yes Necrotic Quality: Adherent Slough Tendon Exposed: No Muscle Exposed: No Joint Exposed: No Bone Exposed: No Treatment Notes THENA DEVORA, TERESA ([id number]) Wound #2 (Left, Circumferential Lower Leg) Notes silvercel, ABD, 3-Layer bilaterally, unna to anchor; sacrum - silvercel and abd Electronic Signature(s) Signed: 04/22/2019 3:54:34 PM By: Army Melia Entered By: Army Melia on 04/22/2019 15:07:47 [name] ([id number]) -------------------------------------------------------------------------------- Wound Assessment Details Patient Name: [name] Date of Service: 04/22/2019 2:45  PM Medical Record Number: [id number] Patient Account Number: [id number] Date of Birth/Sex: September 17, 1970 (48 y.o. F) Treating RN: Army Melia Primary Care Davonte Siebenaler: Rutherford Guys Other Clinician: Referring Kyler Lerette: Rutherford Guys Treating Lisamarie Coke/Extender: STONE III, HOYT Weeks in Treatment: 2 Wound Status Wound Number: 3 Primary Etiology: Medication Related Wound Location: Sacrum - Midline Wound Status: Open Wounding Event: Gradually Appeared Comorbid Asthma, Rheumatoid Arthritis, History: Osteoarthritis Date Acquired: 03/17/2019 Weeks Of Treatment: 1 Clustered Wound: No Photos Wound Measurements Length: (cm) 11 Width: (cm) 2.5 Depth: (cm) 0.1 Area: (cm) 21.598 Volume: (cm) 2.16 % Reduction in Area: -111.5% % Reduction in Volume: -111.6% Epithelialization: None Wound Description Full Thickness Without Exposed Support Foul Odo Classification: Structures Slough/F Wound Margin: Indistinct, nonvisible Exudate Medium Amount: Exudate Type: Serous Exudate Color: amber r After Cleansing: No ibrino Yes Wound Bed Granulation Amount: Medium (34-66%) Exposed Structure Granulation Quality: Pink Fascia Exposed: No Necrotic Amount: Medium (34-66%) Fat Layer (Subcutaneous Tissue) Exposed: Yes Necrotic Quality: Eschar, Adherent Slough Tendon Exposed: No Muscle Exposed: No Joint Exposed: No Bone Exposed: No FLORES VELAZQUEZ, TERESA ([id number]) Treatment Notes Wound #3 (Midline Sacrum) Notes silvercel, ABD, 3-Layer bilaterally, unna to anchor; sacrum - silvercel and abd Electronic Signature(s) Signed: 04/22/2019 3:54:34 PM By: Army Melia Entered By: Army Melia on 04/22/2019 15:08:08 [name] ([id number]) -------------------------------------------------------------------------------- Vitals Details Patient Name: [name] Date of Service: 04/22/2019 2:45 PM Medical Record Number: [id number] Patient Account Number: [id number] Date of  Birth/Sex: [birth year] (48 y.o. F) Treating RN: Montey Hora Primary Care Jayr Lupercio: Rutherford Guys Other Clinician: Referring Candee Hoon: Rutherford Guys Treating Sheanna Dail/Extender: Melburn Hake, HOYT Weeks in Treatment: 2 Vital Signs Time Taken: 14:55 Temperature (F): 98.2 Height (in): 66 Pulse (bpm): 74 Weight (lbs): 221 Respiratory Rate (breaths/min): 18 Body Mass Index (BMI): 35.7 Blood Pressure (mmHg): 136/80 Reference Range: 80 - 120 mg / dl Electronic Signature(s) Signed: 04/22/2019 3:46:15 PM By: Lorine Bears RCP, RRT, CHT Entered By: Lorine Bears on 04/22/2019 14:58:04

## 2019-04-29 ENCOUNTER — Encounter: Payer: Self-pay | Admitting: Medical Oncology

## 2019-04-29 ENCOUNTER — Other Ambulatory Visit: Payer: Self-pay

## 2019-04-29 ENCOUNTER — Ambulatory Visit: Payer: Self-pay | Admitting: Physician Assistant

## 2019-04-29 ENCOUNTER — Observation Stay
Admission: EM | Admit: 2019-04-29 | Discharge: 2019-04-30 | Disposition: A | Discharge status: Home / Self Care | Payer: Self-pay | Attending: Internal Medicine | Admitting: Internal Medicine

## 2019-04-29 DIAGNOSIS — Z7901 Long term (current) use of anticoagulants: Secondary | ICD-10-CM | POA: Insufficient documentation

## 2019-04-29 DIAGNOSIS — R52 Pain, unspecified: Secondary | ICD-10-CM

## 2019-04-29 DIAGNOSIS — M069 Rheumatoid arthritis, unspecified: Secondary | ICD-10-CM | POA: Insufficient documentation

## 2019-04-29 DIAGNOSIS — M1990 Unspecified osteoarthritis, unspecified site: Secondary | ICD-10-CM | POA: Insufficient documentation

## 2019-04-29 DIAGNOSIS — L299 Pruritus, unspecified: Secondary | ICD-10-CM | POA: Insufficient documentation

## 2019-04-29 DIAGNOSIS — E669 Obesity, unspecified: Secondary | ICD-10-CM

## 2019-04-29 DIAGNOSIS — I82409 Acute embolism and thrombosis of unspecified deep veins of unspecified lower extremity: Secondary | ICD-10-CM | POA: Insufficient documentation

## 2019-04-29 DIAGNOSIS — Z20828 Contact with and (suspected) exposure to other viral communicable diseases: Secondary | ICD-10-CM | POA: Insufficient documentation

## 2019-04-29 DIAGNOSIS — Z6835 Body mass index (BMI) 35.0-35.9, adult: Secondary | ICD-10-CM | POA: Insufficient documentation

## 2019-04-29 DIAGNOSIS — M545 Low back pain: Secondary | ICD-10-CM | POA: Insufficient documentation

## 2019-04-29 DIAGNOSIS — R21 Rash and other nonspecific skin eruption: Principal | ICD-10-CM | POA: Diagnosis present

## 2019-04-29 DIAGNOSIS — L03315 Cellulitis of perineum: Secondary | ICD-10-CM

## 2019-04-29 DIAGNOSIS — I10 Essential (primary) hypertension: Secondary | ICD-10-CM | POA: Insufficient documentation

## 2019-04-29 LAB — COMPREHENSIVE METABOLIC PANEL
ALT: 30 U/L (ref 0–44)
AST: 32 U/L (ref 15–41)
Albumin: 4.6 g/dL (ref 3.5–5.0)
Alkaline Phosphatase: 85 U/L (ref 38–126)
Anion gap: 12 (ref 5–15)
BUN: 18 mg/dL (ref 6–20)
CO2: 21 mmol/L — ABNORMAL LOW (ref 22–32)
Calcium: 9.1 mg/dL (ref 8.9–10.3)
Chloride: 103 mmol/L (ref 98–111)
Creatinine, Ser: 0.67 mg/dL (ref 0.44–1.00)
GFR calc Af Amer: >60 mL/min (ref >60)
GFR calc non Af Amer: >60 mL/min (ref >60)
Glucose, Bld: 86 mg/dL (ref 70–99)
Potassium: 4.1 mmol/L (ref 3.5–5.1)
Sodium: 136 mmol/L (ref 135–145)
Total Bilirubin: 1.1 mg/dL (ref 0.3–1.2)
Total Protein: 7.8 g/dL (ref 6.5–8.1)

## 2019-04-29 LAB — CBC WITH DIFFERENTIAL/PLATELET
Abs Immature Granulocytes: 0.03 10*3/uL (ref 0.00–0.07)
Basophils Absolute: 0 10*3/uL (ref 0.0–0.1)
Basophils Relative: 1 %
Eosinophils Absolute: 0.3 10*3/uL (ref 0.0–0.5)
Eosinophils Relative: 5 %
HCT: 41.3 % (ref 36.0–46.0)
Hemoglobin: 13.7 g/dL (ref 12.0–15.0)
Immature Granulocytes: 1 %
Lymphocytes Relative: 27 %
Lymphs Abs: 1.7 10*3/uL (ref 0.7–4.0)
MCH: 27.6 pg (ref 26.0–34.0)
MCHC: 33.2 g/dL (ref 30.0–36.0)
MCV: 83.3 fL (ref 80.0–100.0)
Monocytes Absolute: 0.4 10*3/uL (ref 0.1–1.0)
Monocytes Relative: 6 %
Neutro Abs: 3.9 10*3/uL (ref 1.7–7.7)
Neutrophils Relative %: 60 %
Platelets: 198 10*3/uL (ref 150–400)
RBC: 4.96 MIL/uL (ref 3.87–5.11)
RDW: 13.1 % (ref 11.5–15.5)
WBC: 6.4 10*3/uL (ref 4.0–10.5)
nRBC: 0 % (ref 0.0–0.2)

## 2019-04-29 LAB — HIV ANTIBODY (ROUTINE TESTING W REFLEX): HIV Screen 4th Generation wRfx: NONREACTIVE

## 2019-04-29 MED ORDER — ACETAMINOPHEN 325 MG PO TABS: 650.0000 mg | ORAL_TABLET | Freq: Four times a day (QID) | ORAL | Status: DC | PRN

## 2019-04-29 MED ORDER — DIPHENHYDRAMINE HCL 50 MG/ML IJ SOLN
25.0000 mg | Freq: Three times a day (TID) | INTRAMUSCULAR | Status: DC | PRN
  Administered 2019-04-29 – 2019-04-30 (×2): 25 mg via INTRAVENOUS
  Filled 2019-04-29 (×2): qty 1

## 2019-04-29 MED ORDER — HYDROCORTISONE 1 % EX CREA
TOPICAL_CREAM | Freq: Three times a day (TID) | CUTANEOUS | Status: DC | PRN
  Administered 2019-04-30: 01:00:00 via TOPICAL
  Filled 2019-04-29: qty 28

## 2019-04-29 MED ORDER — VANCOMYCIN HCL IN DEXTROSE 1-5 GM/200ML-% IV SOLN
1000.0000 mg | Freq: Once | INTRAVENOUS | Status: AC
  Administered 2019-04-29: 17:00:00 1000 mg via INTRAVENOUS
  Filled 2019-04-29: qty 200

## 2019-04-29 MED ORDER — HYDROCODONE-ACETAMINOPHEN 5-325 MG PO TABS
1.0000 | ORAL_TABLET | Freq: Once | ORAL | Status: AC
  Administered 2019-04-29: 1 via ORAL
  Filled 2019-04-29: qty 1

## 2019-04-29 MED ORDER — DOCUSATE SODIUM 100 MG PO CAPS: 100.0000 mg | ORAL_CAPSULE | Freq: Two times a day (BID) | ORAL | Status: DC

## 2019-04-29 MED ORDER — METRONIDAZOLE IN NACL 5-0.79 MG/ML-% IV SOLN
500.0000 mg | Freq: Once | INTRAVENOUS | Status: AC
  Administered 2019-04-29: 500 mg via INTRAVENOUS
  Filled 2019-04-29: qty 100

## 2019-04-29 MED ORDER — MORPHINE SULFATE (PF) 4 MG/ML IV SOLN
4.0000 mg | INTRAVENOUS | Status: DC | PRN
  Administered 2019-04-29: 16:00:00 4 mg via INTRAVENOUS
  Filled 2019-04-29 (×2): qty 1

## 2019-04-29 MED ORDER — ONDANSETRON HCL 4 MG/2ML IJ SOLN
4.0000 mg | Freq: Once | INTRAMUSCULAR | Status: AC
  Administered 2019-04-29: 16:00:00 4 mg via INTRAVENOUS
  Filled 2019-04-29: qty 2

## 2019-04-29 MED ORDER — SODIUM CHLORIDE 0.9 % IV SOLN
2.0000 g | Freq: Three times a day (TID) | INTRAVENOUS | Status: DC
  Administered 2019-04-30: 2 g via INTRAVENOUS
  Filled 2019-04-29 (×3): qty 2

## 2019-04-29 MED ORDER — TRAMADOL HCL 50 MG PO TABS: 50.0000 mg | ORAL_TABLET | Freq: Four times a day (QID) | ORAL | Status: DC | PRN

## 2019-04-29 MED ORDER — DIPHENHYDRAMINE HCL 50 MG/ML IJ SOLN
25.0000 mg | Freq: Once | INTRAMUSCULAR | Status: AC
  Administered 2019-04-29: 17:00:00 25 mg via INTRAVENOUS
  Filled 2019-04-29: qty 1

## 2019-04-29 MED ORDER — POLYETHYLENE GLYCOL 3350 17 G PO PACK
17.0000 g | PACK | Freq: Every day | ORAL | Status: DC | PRN
  Filled 2019-04-29: qty 1

## 2019-04-29 MED ORDER — OXYCODONE HCL 5 MG PO TABS
5.0000 mg | ORAL_TABLET | ORAL | Status: DC | PRN
  Administered 2019-04-30: 5 mg via ORAL
  Filled 2019-04-29: qty 1

## 2019-04-29 MED ORDER — DIPHENHYDRAMINE-ZINC ACETATE 2-0.1 % EX CREA
TOPICAL_CREAM | Freq: Three times a day (TID) | CUTANEOUS | Status: DC | PRN
  Filled 2019-04-29: qty 28

## 2019-04-29 MED ORDER — ACETAMINOPHEN 650 MG RE SUPP: 650.0000 mg | Freq: Four times a day (QID) | RECTAL | Status: DC | PRN

## 2019-04-29 MED ORDER — ENOXAPARIN SODIUM 40 MG/0.4ML ~~LOC~~ SOLN
40.0000 mg | SUBCUTANEOUS | Status: DC
  Administered 2019-04-30: 40 mg via SUBCUTANEOUS

## 2019-04-29 MED ORDER — SODIUM CHLORIDE 0.9 % IV SOLN
2.0000 g | Freq: Once | INTRAVENOUS | Status: AC
  Administered 2019-04-29: 2 g via INTRAVENOUS
  Filled 2019-04-29: qty 2

## 2019-04-29 NOTE — H&P (Signed)
[facility] NAME: Nichole Martin    MR#:  [id number]  DATE OF BIRTH:  10-Jul-1970  DATE OF ADMISSION:  04/29/2019  PRIMARY CARE PHYSICIAN: Letta Median, MD   REQUESTING/REFERRING PHYSICIAN: Dr. Quentin Cornwall  Patient coming from : home with daughter patient is Hispanic. Interpreter Kennyth Lose was present during the conversation  CHIEF COMPLAINT:  bilateral lower extremity and buttock rash which is painful and severe itching. Worse since last week.  HISTORY OF PRESENT ILLNESS:  Nichole Martin  is a 48 y.o. female with a known history of asthma, morbid obesity, rheumatoid arthritis-- per patient she does not follow rheumatology this was diagnosed by primary care physician comes to the emergency room with bilateral lower extremity superficial skin ulcers on the tibial shin along with ulcerated skin ulcers on the buttock area and perineal area.  Patient has been followed at the wound center for possible lower extremity venous ulcer. She underwent rounds of antibiotic and Keflex and currently on ciprofloxacin and acyclovir that was started last week by her primary care physician for wound culture positive for Pseudomonas and empiric rx for herpes respectively. She was also given several appointments including Desitin, hydrocortisone cream to apply by her primary care physician  In the ER, Patient has significant burning and itching in the buttock and perineal wounds. Her vitals are stable. Labs are within normal limits Given her positive wound culture from outpatient lab she was given dose of IV cefepime. She is supposed to get Flagyl and vancomycin also.  Blood cultures have been ordered by me. It is being admitted for further evaluation management. Infectious disease and rheumatology consultation placed.  PAST MEDICAL HISTORY:   Past Medical History:  Diagnosis Date  . Arthritis   . Asthma   . Rheumatoid arthritis  ([place])     PAST SURGICAL HISTOIRY:   Past Surgical History:  Procedure Laterality Date  . CESAREAN SECTION    . CHOLECYSTECTOMY      SOCIAL HISTORY:   Social History   Tobacco Use  . Smoking status: Never Smoker  . Smokeless tobacco: Never Used  Substance Use Topics  . Alcohol use: Never    Frequency: Never    FAMILY HISTORY:  No family history on file.  DRUG ALLERGIES:   Allergies  Allergen Reactions  . Indomethacin   . Cephalexin Rash    REVIEW OF SYSTEMS:  Review of Systems  Constitutional: Negative for chills, fever and weight loss.  HENT: Negative for ear discharge, ear pain and nosebleeds.   Eyes: Negative for blurred vision, pain and discharge.  Respiratory: Negative for sputum production, shortness of breath, wheezing and stridor.   Cardiovascular: Negative for chest pain, palpitations, orthopnea and PND.  Gastrointestinal: Negative for abdominal pain, diarrhea, nausea and vomiting.  Genitourinary: Negative for frequency and urgency.  Musculoskeletal: Positive for joint pain. Negative for back pain.  Skin: Positive for itching and rash.  Neurological: Negative for sensory change, speech change, focal weakness and weakness.  Psychiatric/Behavioral: Negative for depression and hallucinations. The patient is not nervous/anxious.      MEDICATIONS AT HOME:   Prior to Admission medications   Not on File      VITAL SIGNS:  Blood pressure (!) 162/82, pulse 94, temperature 98 F (36.7 C), temperature source Oral, resp. rate 18, height 5\' 6"  (1.676 m), weight 100.2 kg, SpO2 98 %.  PHYSICAL EXAMINATION:  GENERAL:  48 y.o.-year-old patient lying in the bed with no  acute distress. Morbidly obese EYES: Pupils equal, round, reactive to light and accommodation. No scleral icterus. Extraocular muscles intact.  HEENT: Head atraumatic, normocephalic. Oropharynx and nasopharynx clear.  NECK:  Supple, no jugular venous distention. No thyroid enlargement, no  tenderness.  LUNGS: Normal breath sounds bilaterally, no wheezing, rales,rhonchi or crepitation. No use of accessory muscles of respiration.  CARDIOVASCULAR: S1, S2 normal. No murmurs, rubs, or gallops.  ABDOMEN: Soft, nontender, nondistended. Bowel sounds present. No organomegaly or mass.  EXTREMITIES:    NEUROLOGIC: Cranial nerves II through XII are intact. Muscle strength 5/5 in all extremities. Sensation intact. Gait not checked.  PSYCHIATRIC: The patient is alert and oriented x 3.  SKIN:     LABORATORY PANEL:   CBC Recent Labs  Lab 04/29/19 1351  WBC 6.4  HGB 13.7  HCT 41.3  PLT 198   ------------------------------------------------------------------------------------------------------------------  Chemistries  Recent Labs  Lab 04/29/19 1351  NA 136  K 4.1  CL 103  CO2 21*  GLUCOSE 86  BUN 18  CREATININE 0.67  CALCIUM 9.1  AST 32  ALT 30  ALKPHOS 85  BILITOT 1.1   ------------------------------------------------------------------------------------------------------------------  Cardiac Enzymes No results for input(s): TROPONINI in the last 168 hours. ------------------------------------------------------------------------------------------------------------------  RADIOLOGY:  No results found.  EKG:    IMPRESSION AND PLAN:    Nichole Martin  is a 48 y.o. female with a known history of asthma, morbid obesity, rheumatoid arthritis-- per patient she does not follow rheumatology this was diagnosed by primary care physician comes to the emergency room with bilateral lower extremity superficial skin ulcers on the tibial shin along with ulcerated skin ulcers on the buttock area and perineal area.  1. Superficial ulcerated skin lesion/rash over bilateral tibial shin and buttock/perineal area-- unclear etiology -differential ? Vasculitis,? Viral,? With superficial bacterial infection -admit to medical floor -will continue IVcefepime -blood culture  -attempt to get wound culture report [facility] -infectious disease consultation --IV Benadryl PRN with Benadryl lotion locally -consider dermatology consultation if there able to come for evaluation. wiil attempt to reach them tomorrow -patient denies any use of new detergent, soap, any new lotion  2. History of? Rheumatoid arthritis -rheumatology consultation placed for Dr. Gavin Potters -I will check ANA, RA factor, consider vasculitis workup  3. Morbid obesity  4. DVT prophylaxis subcu Lovenox  Family Communication: patient and daughter Consults: ID and rheumatology Code Status: full DVT prophylaxis: Lovenox  TOTAL TIME TAKING CARE OF THIS PATIENT: **45* minutes.    Enedina Finner M.D on 04/29/2019 at 4:43 PM  Between 7am to 6pm - Pager - [phone]  After 6pm go to [email or web address] - password [email or web address] [facility]    CC: Primary care physician; Oswaldo Conroy, MD

## 2019-04-29 NOTE — ED Notes (Signed)
IV team at bedside 

## 2019-04-29 NOTE — ED Notes (Signed)
See triage note  Presents with some itching to buttocks  States it started about 2 months ago  Now area has sores

## 2019-04-29 NOTE — ED Notes (Signed)
Dr Azell Der in with pt

## 2019-04-29 NOTE — ED Notes (Signed)
Pt states morphine has made her hot and itchy in the past. Admitting md notifried

## 2019-04-29 NOTE — ED Notes (Signed)
Pt difficult stick, stuck x2 by this RN and Vaughan Basta, Therapist, sports . IV team placed

## 2019-04-29 NOTE — ED Provider Notes (Signed)
[facility] Emergency Department Provider Note  ____________________________________________   First MD Initiated Contact with Patient 04/29/19 1357     (approximate)  I have reviewed the triage vital signs and the nursing notes.   HISTORY  Chief Complaint Abscess    HPI Nichole Martin is a 48 y.o. female presents to the ED complaining of itching and pain to the buttocks.  she started having a rash along the buttock area. She states that her regular doctor has given her acyclovir and Cipro along with hydrocortisone cream.  Patient states that the rash continues to spread and become very painful and itchy.  She states she has sores in her vagina, her anus, and on the butt cheeks.  He denies any history of diabetes.  No fever or chills. Seeing the wound center for care of ulcers and wounds on the lower leg which are mainly healed   Past Medical History:  Diagnosis Date  . Arthritis   . Asthma   . Rheumatoid arthritis [facility])     Patient Active Problem List   Diagnosis Date Noted  . Rash 04/29/2019  . Rheumatoid arthritis of foot (HCC)   . Obesity (BMI 35.0-39.9 without comorbidity)     Past Surgical History:  Procedure Laterality Date  . CESAREAN SECTION    . CHOLECYSTECTOMY      Prior to Admission medications   Not on File    Allergies Indomethacin and Cephalexin  No family history on file.  Social History Social History   Tobacco Use  . Smoking status: Never Smoker  . Smokeless tobacco: Never Used  Substance Use Topics  . Alcohol use: Never    Frequency: Never  . Drug use: Never    Review of Systems  Constitutional: No fever/chills Eyes: No visual changes. ENT: No sore throat. Respiratory: Denies cough Genitourinary: Negative for dysuria. Musculoskeletal: Negative for back pain. Skin: Positive for rash.    ____________________________________________   PHYSICAL EXAM:  VITAL SIGNS: ED Triage Vitals  Enc  Vitals Group     BP 04/29/19 1225 (!) 162/82     Pulse Rate 04/29/19 1225 94     Resp 04/29/19 1225 18     Temp 04/29/19 1225 98 F (36.7 C)     Temp Source 04/29/19 1225 Oral     SpO2 04/29/19 1225 98 %     Weight 04/29/19 1228 221 lb (100.2 kg)     Height 04/29/19 1228 5\' 6"  (1.676 m)     Head Circumference --      Peak Flow --      Pain Score 04/29/19 1228 10     Pain Loc --      Pain Edu? --      Excl. in GC? --     Constitutional: Alert and oriented. Well appearing and in no acute distress. Eyes: Conjunctivae are normal.  Head: Atraumatic. Nose: No congestion/rhinnorhea. Mouth/Throat: Mucous membranes are moist.   Neck:  supple no lymphadenopathy noted Cardiovascular: Normal rate, regular rhythm. Heart sounds are normal Respiratory: Normal respiratory effort.  No retractions, lungs c t a  Abd: soft nontender bs normal all 4 quad GU: deferred Musculoskeletal: FROM all extremities, warm and well perfused Neurologic:  Normal speech and language.  Skin:  Skin is warm, dry, large amount of open sores noted along the right buttocks extending into the vagina some appear to be herpetic versus bacterial, there is a large amount of purulent type discharge noted.  This is strictly  from the skin.  Redness does extend follow-up through the lumbar spinal area Psychiatric: Mood and affect are normal. Speech and behavior are normal.  ____________________________________________   LABS (all labs ordered are listed, but only abnormal results are displayed)  Labs Reviewed  COMPREHENSIVE METABOLIC PANEL - Abnormal; Notable for the following components:      Result Value   CO2 21 (*)    All other components within normal limits  AEROBIC CULTURE (SUPERFICIAL SPECIMEN)  SARS CORONAVIRUS 2 (TAT 6-24 HRS)  CULTURE, BLOOD (ROUTINE X 2)  CULTURE, BLOOD (ROUTINE X 2)  CBC WITH DIFFERENTIAL/PLATELET  HIV ANTIBODY (ROUTINE TESTING W REFLEX)  ANA COMPREHENSIVE PANEL  ANCA TITERS  C3  COMPLEMENT  C4 COMPLEMENT   ____________________________________________   ____________________________________________  RADIOLOGY    ____________________________________________   PROCEDURES  Procedure(s) performed: No  Procedures    ____________________________________________   INITIAL IMPRESSION / ASSESSMENT AND PLAN / ED COURSE  Pertinent labs & imaging results that were available during my care of the patient were reviewed by me and considered in my medical decision making (see chart for details).   Patient is 48 year old female presents emergency department with a painful/itchy rash on the buttocks extending into the vagina.  See HPI  Physical exam shows a severe looking rash that extends from the vagina onto the buttocks and into the lower spine area.  Due to the severity of the rash I had Dr. Quentin Cornwall evaluate patient.  We are going to perform labs, wound culture, and antibiotics.  Possible admission.  CBC did not be abnormal.  Consulted Dr. Posey Pronto for admission.  Should be admitting the patient for pain control/antibiotics   Nichole Martin was evaluated in Emergency Department on 04/29/2019 for the symptoms described in the history of present illness. She was evaluated in the context of the global COVID-19 pandemic, which necessitated consideration that the patient might be at risk for infection with the SARS-CoV-2 virus that causes COVID-19. Institutional protocols and algorithms that pertain to the evaluation of patients at risk for COVID-19 are in a state of rapid change based on information released by regulatory bodies including the CDC and federal and state organizations. These policies and algorithms were followed during the patient's care in the ED.   As part of my medical decision making, I reviewed the following data within the [place] notes reviewed and incorporated, Interpreter needed, Labs reviewed , Old chart  reviewed, Discussed with admitting physician dr patel, Evaluated by EM attending dr Quentin Cornwall, Notes from prior ED visits and [place] Controlled Substance Database  ____________________________________________   FINAL CLINICAL IMPRESSION(S) / ED DIAGNOSES  Final diagnoses:  Cellulitis of perineum      NEW MEDICATIONS STARTED DURING THIS VISIT:  New Prescriptions   No medications on file     Note:  This document was prepared using Dragon voice recognition software and may include unintentional dictation errors.    Versie Starks, PA-C 04/29/19 1740    Merlyn Lot, MD 04/29/19 [phone]

## 2019-04-29 NOTE — ED Triage Notes (Signed)
Using interpreter: Pt reports that she has "itching and sores to my butt". Pt reports she has been having pain for about 2 months.

## 2019-04-29 NOTE — ED Notes (Signed)
Pt appears more comfortable after benadryl, states there is still slight itching. PT has hydrocortisone cream applied from home.

## 2019-04-29 NOTE — Progress Notes (Signed)
PHARMACY -  BRIEF ANTIBIOTIC NOTE   Pharmacy has received consult(s) for Vancomycin and Cefepime from an ED provider.  The patient's profile has been reviewed for ht/wt/allergies/indication/available labs.    One time order(s) placed for Vancomycing 1g IV x 1 and Cefepime 2g IV x 1  Further antibiotics/pharmacy consults should be ordered by admitting physician if indicated.                       Thank you, Pearla Dubonnet 04/29/2019  2:15 PM

## 2019-04-30 ENCOUNTER — Inpatient Hospital Stay: Payer: Self-pay

## 2019-04-30 DIAGNOSIS — Z885 Allergy status to narcotic agent status: Secondary | ICD-10-CM

## 2019-04-30 DIAGNOSIS — I872 Venous insufficiency (chronic) (peripheral): Secondary | ICD-10-CM

## 2019-04-30 DIAGNOSIS — R21 Rash and other nonspecific skin eruption: Secondary | ICD-10-CM | POA: Insufficient documentation

## 2019-04-30 DIAGNOSIS — R52 Pain, unspecified: Secondary | ICD-10-CM

## 2019-04-30 DIAGNOSIS — J45909 Unspecified asthma, uncomplicated: Secondary | ICD-10-CM

## 2019-04-30 DIAGNOSIS — Z881 Allergy status to other antibiotic agents status: Secondary | ICD-10-CM

## 2019-04-30 DIAGNOSIS — L989 Disorder of the skin and subcutaneous tissue, unspecified: Secondary | ICD-10-CM

## 2019-04-30 LAB — SEDIMENTATION RATE: Sed Rate: 17 mm/hr (ref 0–30)

## 2019-04-30 LAB — SARS CORONAVIRUS 2 (TAT 6-24 HRS): SARS Coronavirus 2: NEGATIVE

## 2019-04-30 MED ORDER — VALACYCLOVIR HCL 1 G PO TABS: 1000.0000 mg | ORAL_TABLET | Freq: Two times a day (BID) | ORAL | 0 refills | Status: AC

## 2019-04-30 MED ORDER — VALACYCLOVIR HCL 500 MG PO TABS
1000.0000 mg | ORAL_TABLET | Freq: Two times a day (BID) | ORAL | Status: DC
  Administered 2019-04-30: 1000 mg via ORAL
  Filled 2019-04-30 (×3): qty 2

## 2019-04-30 MED ORDER — FLUCONAZOLE 50 MG PO TABS
200.0000 mg | ORAL_TABLET | Freq: Once | ORAL | Status: AC
  Administered 2019-04-30: 13:00:00 200 mg via ORAL
  Filled 2019-04-30: qty 4

## 2019-04-30 MED ORDER — ENOXAPARIN SODIUM 40 MG/0.4ML ~~LOC~~ SOLN
40.0000 mg | SUBCUTANEOUS | Status: DC
  Filled 2019-04-30: qty 0.4

## 2019-04-30 MED ORDER — OXYCODONE HCL 5 MG PO TABS: 5.0000 mg | ORAL_TABLET | Freq: Four times a day (QID) | ORAL | 0 refills | Status: AC | PRN

## 2019-04-30 NOTE — Consult Note (Signed)
Reason for Consult; rheumatoid arthritis  Referring Physician: Hospitalist  [name]   HPI: 48 year old Hispanic female.  About 6 months ago had some swelling in hands and feet.  Was some erythema and swelling and pain.  Bilateral.  Was placed on meloxicam with significant improvement.  Has been on that since without recurrence.  She was told she had rheumatoid arthritis.  She was to be referred to [facility] Since then she has not had any significant joint pain except in her hips depending on the way she lies in the bed.  Hands have not been swollen.  Is been no numbness.  No Raynaud's.  No triggering.  3 months ago she had swelling the dorsum of the left shin.  Itched.  Some venous disease.  Felt to have ulcerated.  Saw the wound clinic.  Had topicals and antibiotics.  Around the same time she developed itching rash the perineum.  She has recently been treated with Cipro and antiviral. She has not had fevers.  No shortness of breath.  No abdominal pain. CBC metabolic panel and creatinine are normal.  PMH: Hypertension.  Migraines  SURGICAL HISTORY: No joint surgery  Family History: Grandmother had rheumatoid arthritis  Social History: No significant cigarettes or alcohol  Allergies:  Allergies  Allergen Reactions  . Morphine And Related Itching  . Cephalexin Rash  . Indomethacin Other (See Comments) and Rash    Patient stated that indomethacin caused rash. Patient states that she tolerates naproxen. Patient stated that indomethacin caused rash. Patient states that she tolerates naproxen.     Medications:  Scheduled: . docusate sodium  100 mg Oral BID  . enoxaparin (LOVENOX) injection  40 mg Subcutaneous Q24H        ROS: As above   PHYSICAL EXAM: Blood pressure (!) 149/79, pulse 69, temperature 98.7 F (37.1 C), temperature source Oral, resp. rate 16, height 5\' 6"  (1.676 m), weight 100.2 kg, SpO2 97 %. Pleasant female.  No acute distress.  Left shin with  areas of erythema mildly raised.  Areas of hypopigmentation.  Areas of prior excoriation.  No definite blisters.  Numerous ulcers around the perineum.  Mild surrounding erythema. Face without discoid lesions.  No alopecia.  Oropharynx clear.  Clear sclera.  Clear chest.  No murmur.  Nontender abdomen.  No significant edema Musculoskeletal: Good range of motion neck and shoulders.  Elbows without nodules.  Wrists MCPs PIPs without significant synovitis.  Reasonable grip.  Hips move well.  No knee effusions.  MTPs nontender.  Range of motion of her ankles  Assessment: History of joint pain and swelling in the hands about 6 months ago.  Improved on meloxicam.  No current evidence of synovitis Pruritic rash with now ulcers around the perineum.  Does not look like rheumatoid vasculitis or leukocytoclastic vasculitis or pyoderma   Recommendations: Define her joint symptoms.  Rheumatoid factor anti-CCP antibody sed rate hand films Await dermatology opinion regarding her rash.  May need biopsy  Emmaline Kluver 04/30/2019, 9:45 AM

## 2019-04-30 NOTE — Consult Note (Signed)
NAME: Nichole Martin  DOB: 10/22/1970  MRN: [id number]  Date/Time: 04/30/2019 10:56 AM  REQUESTING PROVIDER: Dr. Allena Katz Subjective:  REASON FOR CONSULT: Skin lesions ?History from patient and chart reviewed Nichole Martin is a 48 y.o. female with a history of asthma, presenting to the ED with gluteal wounds for    She was initially  seen in the wound care clinic on 04/07/2019 For lower extremity edema and diagnosed with  bilateral venous insufficiency as well.  As she noted wound on her leg and [place]  Before that she had taken Keflex which unfortunately gave her a rash around her neck, inner thighs and gluteal area. She was given a course of steroids by her PCP.  She has been using gentian violet over the rash in the gluteal region as well as over her lower extremities.  There is a documentation that she has rheumatoid arthritis but there is no lab work to confirm that and neither is she being seen by a rheumatologist. She was last seen on 04/22/2019 at the wound clinic and their note says that the leg ulcers was doing better with the [facility].  The sacral region also looked almost viral like a herpes infection multiple ulceration.Her PCP gave her cipro for pseudomonas cultured from the wound and Acyclovir 400mg  TID which she is still taking She presented to the ED on 04/29/2019 with complaints of itching and soreness to her back with pain.  I am asked to see her for the rash Pt has no fever She has no diarrhea      Past Medical History:  Diagnosis Date  . Arthritis   . Asthma   . Rheumatoid arthritis [facility])     Past Surgical History:  Procedure Laterality Date  . CESAREAN SECTION    . CHOLECYSTECTOMY      Social History   Socioeconomic History  . Marital status: Single    Spouse name: Not on file  . Number of children: Not on file  . Years of education: Not on file  . Highest education level: Not on file   Occupational History  . Not on file  Social Needs  . Financial resource strain: Not on file  . Food insecurity    Worry: Not on file    Inability: Not on file  . Transportation needs    Medical: Not on file    Non-medical: Not on file  Tobacco Use  . Smoking status: Never Smoker  . Smokeless tobacco: Never Used  Substance and Sexual Activity  . Alcohol use: Never    Frequency: Never  . Drug use: Never  . Sexual activity: Not on file  Lifestyle  . Physical activity    Days per week: Not on file    Minutes per session: Not on file  . Stress: Not on file  Relationships  . Social [facility] on phone: Not on file    Gets together: Not on file    Attends religious service: Not on file    Active member of club or organization: Not on file    Attends meetings of clubs or organizations: Not on file    Relationship status: Not on file  . Intimate partner violence    Fear of current or ex partner: Not on file    Emotionally abused: Not on file    Physically abused: Not on file    Forced sexual activity: Not on file  Other  Topics Concern  . Not on file  Social History Narrative  . Not on file    No family history on file. Allergies  Allergen Reactions  . Morphine And Related Itching  . Cephalexin Rash  . Indomethacin Other (See Comments) and Rash    Patient stated that indomethacin caused rash. Patient states that she tolerates naproxen. Patient stated that indomethacin caused rash. Patient states that she tolerates naproxen.     ? Current Facility-Administered Medications  Medication Dose Route Frequency Provider Last Rate Last Dose  . acetaminophen (TYLENOL) tablet 650 mg  650 mg Oral Q6H PRN Enedina Finner, MD       Or  . acetaminophen (TYLENOL) suppository 650 mg  650 mg Rectal Q6H PRN Enedina Finner, MD      . ceFEPIme (MAXIPIME) 2 g in sodium chloride 0.9 % 100 mL IVPB  2 g Intravenous Q8H Enedina Finner, MD   Stopped at 04/30/19 0104  . diphenhydrAMINE  (BENADRYL) injection 25 mg  25 mg Intravenous TID PRN Enedina Finner, MD   25 mg at 04/30/19 0248  . docusate sodium (COLACE) capsule 100 mg  100 mg Oral BID Enedina Finner, MD      . enoxaparin (LOVENOX) injection 40 mg  40 mg Subcutaneous Q24H Enedina Finner, MD      . hydrocortisone cream 1 %   Topical TID PRN Mila Merry A, RPH      . oxyCODONE (Oxy IR/ROXICODONE) immediate release tablet 5 mg  5 mg Oral Q4H PRN Enedina Finner, MD   5 mg at 04/30/19 0230  . polyethylene glycol (MIRALAX / GLYCOLAX) packet 17 g  17 g Oral Daily PRN Enedina Finner, MD      . traMADol Janean Sark) tablet 50 mg  50 mg Oral Q6H PRN Enedina Finner, MD       No current outpatient medications on file.     Abtx:  Anti-infectives (From admission, onward)   Start     Dose/Rate Route Frequency Ordered Stop   04/29/19 2200  ceFEPIme (MAXIPIME) 2 g in sodium chloride 0.9 % 100 mL IVPB     2 g 200 mL/hr over 30 Minutes Intravenous Every 8 hours 04/29/19 1640     04/29/19 1415  ceFEPIme (MAXIPIME) 2 g in sodium chloride 0.9 % 100 mL IVPB     2 g 200 mL/hr over 30 Minutes Intravenous  Once 04/29/19 1402 04/29/19 1638   04/29/19 1415  metroNIDAZOLE (FLAGYL) IVPB 500 mg     500 mg 100 mL/hr over 60 Minutes Intravenous  Once 04/29/19 1402 04/29/19 1929   04/29/19 1415  vancomycin (VANCOCIN) IVPB 1000 mg/200 mL premix     1,000 mg 200 mL/hr over 60 Minutes Intravenous  Once 04/29/19 1402 04/29/19 1815      REVIEW OF SYSTEMS:  Const: negative fever, negative chills, negative weight loss Eyes: negative diplopia or visual changes, negative eye pain ENT: negative coryza, negative sore throat Resp: negative cough, hemoptysis, dyspnea Cards: negative for chest pain, palpitations, lower extremity edema GU: negative for frequency, dysuria and hematuria GI: Negative for abdominal pain, diarrhea, bleeding, constipation Skin: as above Heme: negative for easy bruising and gum/nose bleeding MS: negative for myalgias, arthralgias, back pain and  muscle weakness Neurolo:negative for headaches, dizziness, vertigo, memory problems  Psych: negative for feelings of anxiety, depression  Endocrine: negative for thyroid, diabetes Allergy/Immunology-as noted above Objective:  VITALS:  BP (!) 149/79 (BP Location: Left Arm)   Pulse 69   Temp 98.7 F (37.1  C) (Oral)   Resp 16   Ht 5\' 6"  (1.676 m)   Wt 100.2 kg   SpO2 97%   BMI 35.67 kg/m  PHYSICAL EXAM:  General: Alert, cooperative, no distress, appears stated age.  Head: Normocephalic, without obvious abnormality, atraumatic. Eyes: Conjunctivae clear, anicteric sclerae. Pupils are equal ENT Nares normal. No drainage or sinus tenderness. Lips, mucosa, and tongue normal. No Thrush Neck: Supple, symmetrical, no adenopathy, thyroid: non tender no carotid bruit and no JVD. Back: No CVA tenderness. Lungs: Clear to auscultation bilaterally. No Wheezing or Rhonchi. No rales. Heart: Regular rate and rhythm, no murmur, rub or gallop. Abdomen: Soft, non-tender,not distended. Bowel sounds normal. No masses Extremities:b/l lower extremity varicose veins, edema and venous pigmentation    intergulteal skin has punched out multiple superficials erosions     Skin: No rashes or lesions. Or bruising Lymph: Cervical, supraclavicular normal. Neurologic: Grossly non-focal Pertinent Labs Lab Results CBC    Component Value Date/Time   WBC 6.4 04/29/2019 1351   RBC 4.96 04/29/2019 1351   HGB 13.7 04/29/2019 1351   HCT 41.3 04/29/2019 1351   PLT 198 04/29/2019 1351   MCV 83.3 04/29/2019 1351   MCH 27.6 04/29/2019 1351   MCHC 33.2 04/29/2019 1351   RDW 13.1 04/29/2019 1351   LYMPHSABS 1.7 04/29/2019 1351   MONOABS 0.4 04/29/2019 1351   EOSABS 0.3 04/29/2019 1351   BASOSABS 0.0 04/29/2019 1351    CMP Latest Ref Rng & Units 04/29/2019 03/21/2019  Glucose 70 - 99 mg/dL 86 120(H)  BUN 6 - 20 mg/dL 18 23(H)  Creatinine 0.44 - 1.00 mg/dL 0.67 0.75  Sodium 135 - 145 mmol/L 136 137   Potassium 3.5 - 5.1 mmol/L 4.1 4.3  Chloride 98 - 111 mmol/L 103 106  CO2 22 - 32 mmol/L 21(L) 23  Calcium 8.9 - 10.3 mg/dL 9.1 8.9  Total Protein 6.5 - 8.1 g/dL 7.8 7.0  Total Bilirubin 0.3 - 1.2 mg/dL 1.1 1.2  Alkaline Phos 38 - 126 U/L 85 79  AST 15 - 41 U/L 32 29  ALT 0 - 44 U/L 30 23      Microbiology: Recent Results (from the past 240 hour(s))  Wound or Superficial Culture     Status: None (Preliminary result)   Collection Time: 04/29/19  2:00 PM   Specimen: Abdomen; Wound  Result Value Ref Range Status   Specimen Description   Final    ABDOMEN Performed at [facility], [address], [place], [place] [address]    Special Requests   Final    Normal Performed at [facility], [address] [place], [place] [address]    Gram Stain   Final    NO WBC SEEN NO ORGANISMS SEEN Performed at [facility], [place] [address] [place], [place] [address]    Culture PENDING  Incomplete   Report Status PENDING  Incomplete  SARS CORONAVIRUS 2 (TAT 6-24 HRS) Nasopharyngeal Nasopharyngeal Swab     Status: None   Collection Time: 04/29/19  4:05 PM   Specimen: Nasopharyngeal Swab  Result Value Ref Range Status   SARS Coronavirus 2 NEGATIVE NEGATIVE Final    Comment: (NOTE) SARS-CoV-2 target nucleic acids are NOT DETECTED. The SARS-CoV-2 RNA is generally detectable in upper and lower respiratory specimens during the acute phase of infection. Negative results do not preclude SARS-CoV-2 infection, do not rule out co-infections with other pathogens, and should not be used as the sole basis for treatment or other  patient management decisions. Negative results must be combined with clinical observations, patient history, and epidemiological information. The expected result is Negative. Fact Sheet for Patients: HairSlick.nohttps://www.fda.gov/media/138098/download Fact Sheet for Healthcare Providers: quierodirigir.comhttps://www.fda.gov/media/138095/download This test is not yet  approved or cleared by the [place] FDA and  has been authorized for detection and/or diagnosis of SARS-CoV-2 by FDA under an Emergency Use Authorization (EUA). This EUA will remain  in effect (meaning this test can be used) for the duration of the COVID-19 declaration under Section 56 4(b)(1) of the Act, 21 U.S.C. section 360bbb-3(b)(1), unless the authorization is terminated or revoked sooner. Performed at [facility], [address] [place], [place] [address]   CULTURE, BLOOD (ROUTINE X 2) w Reflex to ID Panel     Status: None (Preliminary result)   Collection Time: 04/29/19  4:50 PM   Specimen: BLOOD  Result Value Ref Range Status   Specimen Description BLOOD BLOOD LEFT HAND  Final   Special Requests   Final    BOTTLES DRAWN AEROBIC AND ANAEROBIC Blood Culture adequate volume   Culture   Final    NO GROWTH < 24 HOURS Performed at [facility], [address] [place], [place] [address]    Report Status PENDING  Incomplete  CULTURE, BLOOD (ROUTINE X 2) w Reflex to ID Panel     Status: None (Preliminary result)   Collection Time: 04/29/19  5:14 PM   Specimen: BLOOD  Result Value Ref Range Status   Specimen Description BLOOD Blood Culture adequate volume  Final   Special Requests   Final    BOTTLES DRAWN AEROBIC AND ANAEROBIC RIGHT ANTECUBITAL   Culture   Final    NO GROWTH < 24 HOURS Performed at [facility], [address] [place], [place] [address]    Report Status PENDING  Incomplete    IMAGING RESULTS: none ? Impression/Recommendation ? Venous  edema with stasis and pigmentation and healed ulcer  left shin.  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  Will send for culture viral culture and  will start valacyclovir for 7-10 days. Antibiotics not needed as previous pseudomonas cultured in the wound at the PCP office was likely colonizing the wound as it did not improve with cipro. Would prefer valcyclovir to  acyclovir for better bioavailability Will also give a dose of fluconazole for intertrigo ? Question of rheumatoid arthritis- no findings to suggest that. Seen by rheumatologist and diagnostic labs have been sent __Will follow her as Op if needed PT will be seeing dermatologist as OP _________________________________________________ Discussed with patient,and Dr.Patel Note:  This document was prepared using Dragon voice recognition software and may include unintentional dictation errors.

## 2019-04-30 NOTE — Discharge Summary (Signed)
[facility] [place]   PATIENT NAME: Nichole Martin    MR#:  [id number]  DATE OF BIRTH:  10/25/1970  DATE OF ADMISSION:  04/29/2019 ADMITTING PHYSICIAN: No admitting provider for patient encounter.  DATE OF DISCHARGE: 04/30/2019  PRIMARY CARE PHYSICIAN: Oswaldo ConroyBender, Abby Daneele, MD    ADMISSION DIAGNOSIS:  lower back pain  DISCHARGE DIAGNOSIS:  Rash on the Buttocks/Perineum likley Herpetic  SECONDARY DIAGNOSIS:   Past Medical History:  Diagnosis Date  . Arthritis   . Asthma   . Rheumatoid arthritis [facility](HCC)     HOSPITAL COURSE:   Nichole Martin  is a 48 y.o. female with a known history of asthma, morbid obesity, rheumatoid arthritis-- per patient she does not follow rheumatology this was diagnosed by primary care physician comes to the emergency room with bilateral lower extremity superficial skin ulcers on the tibial shin along with ulcerated skin ulcers on the buttock area and perineal area.  1. 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 Treating like Herpetic lesions with Valtrex -HSV and VZV PCR sent --po Benadryl PRN  -SPoke with Dr Dascher dermatology --picutres sent, pt will f/u with Dr Dascher next week  2. History of? Rheumatoid arthritis -rheumatology consultation r Dr. Gavin PottersKernodle appreicated--does not appear to be vascilitis -f/u labs as out pt  3. Morbid obesity  4. DVT prophylaxis subcu Lovenox  Pt will d/c home today. Pt is agreement  Family Communication: patient and daughter Hansel Starlingdrienne Consults: ID, Dermatology and rheumatology Code Status: full DVT prophylaxis: Lovenox CONSULTS OBTAINED:  Treatment Team:  Lynn Itoavishankar, Jayashree, MD Kandyce RudKernodle, George W Jr., MD  DRUG ALLERGIES:   Allergies  Allergen Reactions  . Morphine And Related Itching  . Cephalexin Rash  .  Indomethacin Other (See Comments) and Rash    Patient stated that indomethacin caused rash. Patient states that she tolerates naproxen. Patient stated that indomethacin caused rash. Patient states that she tolerates naproxen.     DISCHARGE MEDICATIONS:   Allergies as of 04/30/2019      Reactions   Morphine And Related Itching   Cephalexin Rash   Indomethacin Other (See Comments), Rash   Patient stated that indomethacin caused rash. Patient states that she tolerates naproxen. Patient stated that indomethacin caused rash. Patient states that she tolerates naproxen.      Medication List    TAKE these medications   oxyCODONE 5 MG immediate release tablet Commonly known as: Oxy IR/ROXICODONE Take 1 tablet (5 mg total) by mouth every 6 (six) hours as needed for severe pain.   valACYclovir 1000 MG tablet Commonly known as: VALTREX Take 1 tablet (1,000 mg total) by mouth 2 (two) times daily.       If you experience worsening of your admission symptoms, develop shortness of breath, life threatening emergency, suicidal or homicidal thoughts you must seek medical attention immediately by calling 911 or calling your MD immediately  if symptoms less severe.  You Must read complete instructions/literature along with all the possible adverse reactions/side effects for all the Medicines you take and that have been prescribed to you. Take any new Medicines after you have completely understood and accept all the possible adverse reactions/side effects.   Please note  You were cared for by a hospitalist during your hospital stay. If you have any questions about your discharge medications or the care you received while you were in the hospital after you are discharged,  you can call the unit and asked to speak with the hospitalist on call if the hospitalist that took care of you is not available. Once you are discharged, your primary care physician will handle any further medical issues. Please  note that NO REFILLS for any discharge medications will be authorized once you are discharged, as it is imperative that you return to your primary care physician (or establish a relationship with a primary care physician if you do not have one) for your aftercare needs so that they can reassess your need for medications and monitor your lab values. Today   SUBJECTIVE   Still itching but much settled  VITAL SIGNS:  Blood pressure (!) 149/79, pulse 69, temperature 98.7 F (37.1 C), temperature source Oral, resp. rate 16, height 5\' 6"  (1.676 m), weight 100.2 kg, SpO2 97 %.  I/O:    Intake/Output Summary (Last 24 hours) at 04/30/2019 1258 Last data filed at 04/30/2019 0104 Gross per 24 hour  Intake 340 ml  Output -  Net 340 ml    PHYSICAL EXAMINATION:  GENERAL:  48 y.o.-year-old patient lying in the bed with no acute distress.  EYES: Pupils equal, round, reactive to light and accommodation. No scleral icterus. Extraocular muscles intact.  HEENT: Head atraumatic, normocephalic. Oropharynx and nasopharynx clear.  NECK:  Supple, no jugular venous distention. No thyroid enlargement, no tenderness.  LUNGS: Normal breath sounds bilaterally, no wheezing, rales,rhonchi or crepitation. No use of accessory muscles of respiration.  CARDIOVASCULAR: S1, S2 normal. No murmurs, rubs, or gallops.  ABDOMEN: Soft, non-tender, non-distended. Bowel sounds present. No organomegaly or mass.  EXTREMITIES: No pedal edema, cyanosis, or clubbing.  NEUROLOGIC: Cranial nerves II through XII are intact. Muscle strength 5/5 in all extremities. Sensation intact. Gait not checked.  PSYCHIATRIC: The patient is alert and oriented x 3.  SKIN:       DATA REVIEW:   CBC  Recent Labs  Lab 04/29/19 1351  WBC 6.4  HGB 13.7  HCT 41.3  PLT 198    Chemistries  Recent Labs  Lab 04/29/19 1351  NA 136  K 4.1  CL 103  CO2 21*  GLUCOSE 86  BUN 18  CREATININE 0.67  CALCIUM 9.1  AST 32  ALT 30  ALKPHOS 85   BILITOT 1.1    Microbiology Results   Recent Results (from the past 240 hour(s))  Wound or Superficial Culture     Status: None (Preliminary result)   Collection Time: 04/29/19  2:00 PM   Specimen: Abdomen; Wound  Result Value Ref Range Status   Specimen Description   Final    ABDOMEN Performed at [facility], [address] [place], [place] [address]    Special Requests   Final    Normal Performed at [facility], [place], [place] [address]    Gram Stain NO WBC SEEN NO ORGANISMS SEEN   Final   Culture   Final    NO GROWTH < 24 HOURS Performed at [facility], [place] [address] [place], [place] [address]    Report Status PENDING  Incomplete  SARS CORONAVIRUS 2 (TAT 6-24 HRS) Nasopharyngeal Nasopharyngeal Swab     Status: None   Collection Time: 04/29/19  4:05 PM   Specimen: Nasopharyngeal Swab  Result Value Ref Range Status   SARS Coronavirus 2 NEGATIVE NEGATIVE Final    Comment: (NOTE) SARS-CoV-2 target nucleic acids are NOT DETECTED. The SARS-CoV-2 RNA is generally detectable in upper and lower  respiratory specimens during the acute phase of infection. Negative results do not preclude SARS-CoV-2 infection, do not rule out co-infections with other pathogens, and should not be used as the sole basis for treatment or other patient management decisions. Negative results must be combined with clinical observations, patient history, and epidemiological information. The expected result is Negative. Fact Sheet for Patients: HairSlick.nohttps://www.fda.gov/media/138098/download Fact Sheet for Healthcare Providers: quierodirigir.comhttps://www.fda.gov/media/138095/download This test is not yet approved or cleared by the [place] FDA and  has been authorized for detection and/or diagnosis of SARS-CoV-2 by FDA under an Emergency Use Authorization (EUA). This EUA will remain  in effect (meaning this test can be used) for the duration of the COVID-19  declaration under Section 56 4(b)(1) of the Act, 21 U.S.C. section 360bbb-3(b)(1), unless the authorization is terminated or revoked sooner. Performed at [facility], [address] [place], [place] [address]   CULTURE, BLOOD (ROUTINE X 2) w Reflex to ID Panel     Status: None (Preliminary result)   Collection Time: 04/29/19  4:50 PM   Specimen: BLOOD  Result Value Ref Range Status   Specimen Description BLOOD BLOOD LEFT HAND  Final   Special Requests   Final    BOTTLES DRAWN AEROBIC AND ANAEROBIC Blood Culture adequate volume   Culture   Final    NO GROWTH < 24 HOURS Performed at [facility], [address] [place], [place] [address]    Report Status PENDING  Incomplete  CULTURE, BLOOD (ROUTINE X 2) w Reflex to ID Panel     Status: None (Preliminary result)   Collection Time: 04/29/19  5:14 PM   Specimen: BLOOD  Result Value Ref Range Status   Specimen Description BLOOD Blood Culture adequate volume  Final   Special Requests   Final    BOTTLES DRAWN AEROBIC AND ANAEROBIC RIGHT ANTECUBITAL   Culture   Final    NO GROWTH < 24 HOURS Performed at [facility], [address] [place], [place] [address]    Report Status PENDING  Incomplete    RADIOLOGY:  Dg Hand 2 View Right  Result Date: 04/30/2019 CLINICAL DATA:  48 year old female with pain in both hands. EXAM: RIGHT HAND - 2 VIEW; LEFT HAND - 2 VIEW COMPARISON:  None. FINDINGS: There is no acute fracture or dislocation. Mild juxta-articular osteopenia. No arthritic changes. The soft tissues are unremarkable. IMPRESSION: No acute fracture or dislocation. Electronically Signed   By: Elgie CollardArash  Radparvar M.D.   On: 04/30/2019 10:14   Dg Hand 2 View Left  Result Date: 04/30/2019 CLINICAL DATA:  48 year old female with pain in both hands. EXAM: RIGHT HAND - 2 VIEW; LEFT HAND - 2 VIEW COMPARISON:  None. FINDINGS: There is no acute fracture or dislocation. Mild juxta-articular osteopenia. No arthritic  changes. The soft tissues are unremarkable. IMPRESSION: No acute fracture or dislocation. Electronically Signed   By: Elgie CollardArash  Radparvar M.D.   On: 04/30/2019 10:14     CODE STATUS:     Code Status Orders  (From admission, onward)         Start     Ordered   04/29/19 2348  Full code  Continuous     04/29/19 2347        Code Status History    This patient has a current code status but no historical code status.   Advance Care Planning Activity     Family Discussion:dter Adrienne Consults: ID, Rheumatology and [facility] (Dr Dascher)   TOTAL  TIME TAKING CARE OF THIS PATIENT: *40 minutes.    Enedina Finner M.D on 04/30/2019 at 12:58 PM  Between 7am to 6pm - Pager - [phone] After 6pm go to [email or web address] - password [email or web address]  [facility]    CC: Primary care physician; Oswaldo Conroy, MD

## 2019-05-01 LAB — AEROBIC CULTURE W GRAM STAIN (SUPERFICIAL SPECIMEN)
Culture: NO GROWTH
Gram Stain: NONE SEEN
Special Requests: NORMAL

## 2019-05-01 LAB — ANCA TITERS
Atypical P-ANCA titer: <1:20 {titer}
C-ANCA: <1:20 {titer}
P-ANCA: <1:20 {titer}

## 2019-05-01 LAB — C4 COMPLEMENT: Complement C4, Body Fluid: 36 mg/dL (ref 12–38)

## 2019-05-01 LAB — RHEUMATOID FACTOR: Rheumatoid fact SerPl-aCnc: 19.6 IU/mL — ABNORMAL HIGH (ref 0.0–13.9)

## 2019-05-01 LAB — C3 COMPLEMENT: C3 Complement: 136 mg/dL (ref 82–167)

## 2019-05-02 LAB — CYCLIC CITRUL PEPTIDE ANTIBODY, IGG/IGA: CCP Antibodies IgG/IgA: 6 units (ref 0–19)

## 2019-05-02 LAB — ANA COMPREHENSIVE PANEL
Anti JO-1: <0.2 AI (ref 0.0–0.9)
Centromere Ab Screen: <0.2 AI (ref 0.0–0.9)
Chromatin Ab SerPl-aCnc: <0.2 AI (ref 0.0–0.9)
ENA SM Ab Ser-aCnc: <0.2 AI (ref 0.0–0.9)
Ribonucleic Protein: 0.5 AI (ref 0.0–0.9)
SSA (Ro) (ENA) Antibody, IgG: <0.2 AI (ref 0.0–0.9)
SSB (La) (ENA) Antibody, IgG: <0.2 AI (ref 0.0–0.9)
Scleroderma (Scl-70) (ENA) Antibody, IgG: <0.2 AI (ref 0.0–0.9)
ds DNA Ab: <1 IU/mL (ref 0–9)

## 2019-05-04 LAB — CULTURE, BLOOD (ROUTINE X 2)
Culture: NO GROWTH
Culture: NO GROWTH
Special Requests: ADEQUATE
Specimen Description: ADEQUATE

## 2019-05-06 LAB — VARICELLA-ZOSTER BY PCR: Varicella-Zoster, PCR: NEGATIVE

## 2019-05-08 ENCOUNTER — Ambulatory Visit: Payer: Self-pay | Admitting: Physician Assistant

## 2019-05-09 ENCOUNTER — Other Ambulatory Visit: Payer: Self-pay

## 2019-05-09 ENCOUNTER — Encounter: Payer: Self-pay | Attending: Physician Assistant | Admitting: Physician Assistant

## 2019-05-09 DIAGNOSIS — L97822 Non-pressure chronic ulcer of other part of left lower leg with fat layer exposed: Secondary | ICD-10-CM | POA: Insufficient documentation

## 2019-05-09 DIAGNOSIS — Z8249 Family history of ischemic heart disease and other diseases of the circulatory system: Secondary | ICD-10-CM | POA: Insufficient documentation

## 2019-05-09 DIAGNOSIS — L97812 Non-pressure chronic ulcer of other part of right lower leg with fat layer exposed: Secondary | ICD-10-CM | POA: Insufficient documentation

## 2019-05-09 DIAGNOSIS — J45909 Unspecified asthma, uncomplicated: Secondary | ICD-10-CM | POA: Insufficient documentation

## 2019-05-09 DIAGNOSIS — I872 Venous insufficiency (chronic) (peripheral): Secondary | ICD-10-CM | POA: Insufficient documentation

## 2019-05-09 DIAGNOSIS — L98492 Non-pressure chronic ulcer of skin of other sites with fat layer exposed: Secondary | ICD-10-CM | POA: Insufficient documentation

## 2019-05-09 DIAGNOSIS — M069 Rheumatoid arthritis, unspecified: Secondary | ICD-10-CM | POA: Insufficient documentation

## 2019-05-09 NOTE — Progress Notes (Signed)
Nichole Martin, Nichole Martin ([id number]) Visit Report for 05/09/2019 Arrival Information Details Patient Name: Nichole Martin, Nichole Martin Date of Service: 05/09/2019 8:45 AM Medical Record Number: [id number] Patient Account Number: [id number] Date of Birth/Sex: August 20, 1970 (48 y.o. F) Treating RN: Army Melia Primary Care Dillinger Aston: Rutherford Guys Other Clinician: Referring Lakeishia Truluck: Rutherford Guys Treating Zamariya Neal/Extender: Melburn Hake, HOYT Weeks in Treatment: 4 Visit Information History Since Last Visit Added or deleted any medications: No Patient Arrived: Ambulatory Any new allergies or adverse reactions: No Arrival Time: 08:41 Had a fall or experienced change in No Accompanied By: self activities of daily living that may affect Transfer Assistance: None risk of falls: Patient Identification Verified: Yes Signs or symptoms of abuse/neglect since last visito No Hospitalized since last visit: No Has Dressing in Place as Prescribed: Yes Pain Present Now: No Electronic Signature(s) Signed: 05/09/2019 4:16:49 PM By: Harold Barban Entered By: Harold Barban on 05/09/2019 08:53:47 Nichole Martin ([id number]) -------------------------------------------------------------------------------- Clinic Level of Care Assessment Details Patient Name: Nichole Martin Date of Service: 05/09/2019 8:45 AM Medical Record Number: [id number] Patient Account Number: [id number] Date of Birth/Sex: July 15, 1970 (48 y.o. F) Treating RN: Montey Hora Primary Care Daliana Leverett: Rutherford Guys Other Clinician: Referring Vada Yellen: Rutherford Guys Treating Dakia Schifano/Extender: Melburn Hake, HOYT Weeks in Treatment: 4 Clinic Level of Care Assessment Items TOOL 4 Quantity Score []  - Use when only an EandM is performed on FOLLOW-UP visit 0 ASSESSMENTS - Nursing Assessment / Reassessment []  - Reassessment of Co-morbidities (includes updates in patient status) 0 []  - 0 Reassessment of Adherence to Treatment  Plan ASSESSMENTS - Wound and Skin Assessment / Reassessment []  - Simple Wound Assessment / Reassessment - one wound 0 X- 2 5 Complex Wound Assessment / Reassessment - multiple wounds []  - 0 Dermatologic / Skin Assessment (not related to wound area) ASSESSMENTS - Focused Assessment []  - Circumferential Edema Measurements - multi extremities 0 []  - 0 Nutritional Assessment / Counseling / Intervention X- 1 5 Lower Extremity Assessment (monofilament, tuning fork, pulses) []  - 0 Peripheral Arterial Disease Assessment (using hand held doppler) ASSESSMENTS - Ostomy and/or Continence Assessment and Care []  - Incontinence Assessment and Management 0 []  - 0 Ostomy Care Assessment and Management (repouching, etc.) PROCESS - Coordination of Care X - Simple Patient / Family Education for ongoing care 1 15 []  - 0 Complex (extensive) Patient / Family Education for ongoing care X- 1 10 Staff obtains Programmer, systems, Records, Test Results / Process Orders []  - 0 Staff telephones HHA, Nursing Homes / Clarify orders / etc []  - 0 Routine Transfer to another Facility (non-emergent condition) []  - 0 Routine Hospital Admission (non-emergent condition) []  - 0 New Admissions / Biomedical engineer / Ordering NPWT, Apligraf, etc. []  - 0 Emergency Hospital Admission (emergent condition) X- 1 10 Simple Discharge Coordination Nichole Martin, Nichole Martin ([id number]) []  - 0 Complex (extensive) Discharge Coordination PROCESS - Special Needs []  - Pediatric / Minor Patient Management 0 []  - 0 Isolation Patient Management []  - 0 Hearing / Language / Visual special needs []  - 0 Assessment of Community assistance (transportation, D/C planning, etc.) []  - 0 Additional assistance / Altered mentation []  - 0 Support Surface(s) Assessment (bed, cushion, seat, etc.) INTERVENTIONS - Wound Cleansing / Measurement []  - Simple Wound Cleansing - one wound 0 X- 2 5 Complex Wound Cleansing - multiple wounds X- 1  5 Wound Imaging (photographs - any number of wounds) []  - 0 Wound Tracing (instead of photographs) []  - 0 Simple Wound Measurement - one wound X- 2 5 Complex  Wound Measurement - multiple wounds INTERVENTIONS - Wound Dressings []  - Small Wound Dressing one or multiple wounds 0 []  - 0 Medium Wound Dressing one or multiple wounds []  - 0 Large Wound Dressing one or multiple wounds []  - 0 Application of Medications - topical []  - 0 Application of Medications - injection INTERVENTIONS - Miscellaneous []  - External ear exam 0 []  - 0 Specimen Collection (cultures, biopsies, blood, body fluids, etc.) []  - 0 Specimen(s) / Culture(s) sent or taken to Lab for analysis []  - 0 Patient Transfer (multiple staff / / Similar devices) []  - 0 Simple Staple / Suture removal (25 or less) []  - 0 Complex Staple / Suture removal (26 or more) []  - 0 Hypo / Hyperglycemic Management (close monitor of Blood Glucose) []  - 0 Ankle / Brachial Index (ABI) - do not check if billed separately X- 1 5 Vital Signs Nichole Martin, Nichole Martin ( ) Has the patient been seen at the hospital within the last three years: Yes Total Score: 80 Level Of Care: New/Established - Level 3 Electronic Signature(s) Signed: 05/09/2019 5:08:51 PM By: Entered By: on 05/09/2019 10:17:12 ( ) -------------------------------------------------------------------------------- Encounter Discharge Information Details Patient Name: Date of Service: 05/09/2019 8:45 AM Medical Record Number: Patient Account Number: Date of Birth/Sex: [birth year] (48 y.o. F) Treating RN: [id number] Primary Care Cottrell Gentles: 14/09/2018 Other Clinician: Referring Shakeeta Godette: Curtis Sites Treating Bonnye Halle/Extender: Curtis Sites, HOYT Weeks in Treatment: 4 Encounter Discharge Information Items Discharge Condition: Stable Ambulatory  Status: Ambulatory Discharge Destination: Home Transportation: Private Auto Accompanied By: interpreter Schedule Follow-up Appointment: No Clinical Summary of Care: Electronic Signature(s) Signed: 05/09/2019 10:18:49 AM By: [name] Entered By: [id number] on 05/09/2019 10:18:49 14/09/2018 ([id number]) -------------------------------------------------------------------------------- Lower Extremity Assessment Details Patient Name: [id number] Date of Service: 05/09/2019 8:45 AM Medical Record Number: [id number] Patient Account Number: Curtis Sites Date of Birth/Sex: [birth year] (48 y.o. F) Treating RN: Linwood Dibbles Primary Care Elvert Cumpton: 14/09/2018 Other Clinician: Referring Gerell Fortson: Curtis Sites Treating Brandilynn Taormina/Extender: Curtis Sites, HOYT Weeks in Treatment: 4 Vascular Assessment Pulses: Dorsalis Pedis Palpable: [Left:Yes] Posterior Tibial Palpable: [Left:Yes] Electronic Signature(s) Signed: 05/09/2019 4:16:49 PM By: [name] Entered By: [id number] on 05/09/2019 09:09:05 14/09/2018 ([id number]) -------------------------------------------------------------------------------- Multi-Disciplinary Care Plan Details Patient Name: [id number] Date of Service: 05/09/2019 8:45 AM Medical Record Number: [id number] Patient Account Number: Arnette Norris Date of Birth/Sex: [birth year] (48 y.o. F) Treating RN: Tonia Ghent Primary Care Mustaf Antonacci: Linwood Dibbles Other Clinician: Referring Nariyah Osias: 14/09/2018 Treating Haston Casebolt/Extender: Arnette Norris, HOYT Weeks in Treatment: 4 Active Inactive Electronic Signature(s) Signed: 05/09/2019 10:16:23 AM By: 14/09/2018 Entered By: [name] on 05/09/2019 10:16:23 [name] (14/09/2018) -------------------------------------------------------------------------------- Pain Assessment Details Patient Name: [id number] Date of Service: 05/09/2019 8:45  AM Medical Record Number: 08/05/1970 Patient Account Number: Curtis Sites Date of Birth/Sex: [birth year] (48 y.o. F) Treating RN: Linwood Dibbles Primary Care Costas Sena: 14/09/2018 Other Clinician: Referring Paco Cislo: Curtis Sites Treating Abhimanyu Cruces/Extender: Curtis Sites, HOYT Weeks in Treatment: 4 Active Problems Location of Pain Severity and Description of Pain Patient Has Paino No Site Locations Pain Management and Medication Current Pain Management: Electronic Signature(s) Signed: 05/09/2019 4:16:49 PM By: [name] Entered By: [id number] on 05/09/2019 08:54:06 14/09/2018 ([id number]) -------------------------------------------------------------------------------- Patient/Caregiver Education Details Patient Name: [id number] Date of Service: 05/09/2019 8:45 AM Medical Record Number: [id number] Patient Account Number: Arnette Norris Date of Birth/Gender: 07/03/1970 (48 y.o. F) Treating RN: Tonia Ghent,  Mardene CelesteJoanna Primary Care Physician: Tonia GhentBENDER, ABBY Other Clinician: Referring Physician: Tonia GhentBENDER, ABBY Treating Physician/Extender: Skeet SimmerSTONE III, HOYT Weeks in Treatment: 4 Education Assessment Education Provided To: Patient Education Topics Provided Basic Hygiene: Handouts: Other: f/u with dermatology Methods: Explain/Verbal Responses: State content correctly Electronic Signature(s) Signed: 05/09/2019 5:08:51 PM By: Curtis Sitesorthy, Joanna Entered By: Curtis Sitesorthy, Joanna on 05/09/2019 10:18:05 Tomie ChinaFLORES Martin, Nichole Martin ([id number]) -------------------------------------------------------------------------------- Wound Assessment Details Patient Name: Tomie ChinaFLORES Martin, Nichole Martin Date of Service: 05/09/2019 8:45 AM Medical Record Number: [id number] Patient Account Number: [id number] Date of Birth/Sex: 03/11/1971 (48 y.o. F) Treating RN: Curtis Sitesorthy, Joanna Primary Care Kaylan Friedmann: Tonia GhentBENDER, ABBY Other Clinician: Referring Zerek Litsey: Tonia GhentBENDER, ABBY Treating Nery Frappier/Extender: Linwood DibblesSTONE III, HOYT Weeks  in Treatment: 4 Wound Status Wound Number: 2 Primary Etiology: Venous Leg Ulcer Wound Location: Left, Circumferential Lower Leg Wound Status: Healed - Epithelialized Wounding Event: Gradually Appeared Comorbid Asthma, Rheumatoid Arthritis, History: Osteoarthritis Date Acquired: 01/04/2019 Weeks Of Treatment: 4 Clustered Wound: No Photos Wound Measurements Length: (cm) 0 % Redu Width: (cm) 0 % Redu Depth: (cm) 0 Epithe Area: (cm) 0 Tunne Volume: (cm) 0 Under ction in Area: 100% ction in Volume: 100% lialization: Small (1-33%) ling: No mining: No Wound Description Full Thickness Without Exposed Support Foul O Classification: Structures Slough Wound Margin: Flat and Intact Exudate None Present Amount: dor After Cleansing: No /Fibrino Yes Wound Bed Granulation Amount: Large (67-100%) Exposed Structure Granulation Quality: Pink Fascia Exposed: No Necrotic Amount: Small (1-33%) Fat Layer (Subcutaneous Tissue) Exposed: No Necrotic Quality: Adherent Slough Tendon Exposed: No Muscle Exposed: No Joint Exposed: No Bone Exposed: No Electronic Signature(s) Signed: 05/09/2019 5:08:51 PM By: Verl Blalockorthy, Joanna Nichole Martin, Nichole Martin ([id number]) Entered By: Curtis Sitesorthy, Joanna on 05/09/2019 09:18:41 Levada DyFLORES Martin, Rosey BathERESA ([id number]) -------------------------------------------------------------------------------- Wound Assessment Details Patient Name: Tomie ChinaFLORES Martin, Nichole Martin Date of Service: 05/09/2019 8:45 AM Medical Record Number: [id number] Patient Account Number: [id number] Date of Birth/Sex: 08/14/1970 (48 y.o. F) Treating RN: Arnette NorrisBiell, Kristina Primary Care Myan Suit: Tonia GhentBENDER, ABBY Other Clinician: Referring Jalyah Weinheimer: Tonia GhentBENDER, ABBY Treating Tanai Bouler/Extender: Linwood DibblesSTONE III, HOYT Weeks in Treatment: 4 Wound Status Wound Number: 3 Primary Etiology: Medication Related Wound Location: Sacrum - Midline Wound Status: Open Wounding Event: Gradually Appeared Comorbid Asthma, Rheumatoid  Arthritis, History: Osteoarthritis Date Acquired: 03/17/2019 Weeks Of Treatment: 3 Clustered Wound: No Photos Wound Measurements Length: (cm) 15 Width: (cm) 2.5 Depth: (cm) 0.1 Area: (cm) 29.452 Volume: (cm) 2.945 % Reduction in Area: -188.5% % Reduction in Volume: -188.4% Epithelialization: None Tunneling: No Undermining: No Wound Description Full Thickness Without Exposed Support Foul Odo Classification: Structures Slough/F Wound Margin: Indistinct, nonvisible Exudate Medium Amount: Exudate Type: Serous Exudate Color: amber r After Cleansing: No ibrino Yes Wound Bed Granulation Amount: Medium (34-66%) Exposed Structure Granulation Quality: Pink Fascia Exposed: No Necrotic Amount: Medium (34-66%) Fat Layer (Subcutaneous Tissue) Exposed: Yes Necrotic Quality: Eschar, Adherent Slough Tendon Exposed: No Muscle Exposed: No Joint Exposed: No Bone Exposed: No Tomie ChinaFLORES Martin, Nichole Martin ([id number]) Electronic Signature(s) Signed: 05/09/2019 4:16:49 PM By: Arnette NorrisBiell, Kristina Entered By: Arnette NorrisBiell, Kristina on 05/09/2019 09:08:53 Tomie ChinaFLORES Martin, Nichole Martin ([id number]) -------------------------------------------------------------------------------- Vitals Details Patient Name: Tomie ChinaFLORES Martin, Nichole Martin Date of Service: 05/09/2019 8:45 AM Medical Record Number: [id number] Patient Account Number: [id number] Date of Birth/Sex: 04/28/1971 (48 y.o. F) Treating RN: Arnette NorrisBiell, Kristina Primary Care Jajuan Skoog: Tonia GhentBENDER, ABBY Other Clinician: Referring Olla Delancey: Tonia GhentBENDER, ABBY Treating Gissela Bloch/Extender: Linwood DibblesSTONE III, HOYT Weeks in Treatment: 4 Vital Signs Time Taken: 08:50 Temperature (F): 98.3 Height (in): 66 Pulse (bpm): 85 Weight (lbs): 221 Respiratory Rate (breaths/min): 18 Body Mass Index (BMI): 35.7 Blood Pressure (mmHg): 144/70 Reference Range: 80 - 120  mg / dl Electronic Signature(s) Signed: 05/09/2019 4:16:49 PM By: Arnette Norris Entered By: Arnette Norris on 05/09/2019  08:55:11

## 2019-05-09 NOTE — Progress Notes (Addendum)
Nichole Martin, Nichole Martin ([id number]) Visit Report for 05/09/2019 Chief Complaint Document Details Patient Name: Nichole Martin, Nichole Martin Date of Service: 05/09/2019 8:45 AM Medical Record Number: [id number] Patient Account Number: [id number] Date of Birth/Sex: 08/21/1970 (48 y.o. F) Treating RN: Curtis Sitesorthy, Joanna Primary Care Provider: Tonia GhentBENDER, ABBY Other Clinician: Referring Provider: Tonia GhentBENDER, ABBY Treating Provider/Extender: Linwood DibblesSTONE III, HOYT Weeks in Treatment: 4 Information Obtained from: Patient Chief Complaint Bilateral LE Ulcers Electronic Signature(s) Signed: 05/09/2019 9:16:28 AM By: Lenda KelpStone III, Hoyt PA-C Entered By: Lenda KelpStone III, Hoyt on 05/09/2019 09:16:28 Levada DyFLORES Martin, Rosey BathERESA ([id number]) -------------------------------------------------------------------------------- HPI Details Patient Name: Nichole Martin, Nichole Martin Date of Service: 05/09/2019 8:45 AM Medical Record Number: [id number] Patient Account Number: [id number] Date of Birth/Sex: 01/08/1971 (48 y.o. F) Treating RN: Curtis Sitesorthy, Joanna Primary Care Provider: Tonia GhentBENDER, ABBY Other Clinician: Referring Provider: Tonia GhentBENDER, ABBY Treating Provider/Extender: Linwood DibblesSTONE III, HOYT Weeks in Treatment: 4 History of Present Illness HPI Description: 04/07/19 patient presents today for initial evaluation our clinic concerning issues that she's been having with her bilateral lower extremities. She does have some lower extremity edema evidence of venous insufficiency as well. She tells me that the wounds in general seem to be doing somewhat better after she began to take some oral [place] When she had the Keflex he unfortunately developed a rash and ended up having an issue with the rash on her gluteal and anal region. She has been putting the gentian violet over the rash in the gluteal region as well as over her lower extremities. She feels like this is helping to dry things out. Fortunately there is no evidence of  active infection at this time although she was in the hospital for cellulitis as well. The patient does state history of rheumatoid arthritis although she doesn't have a current doctor that she sees. No fevers, chills, nausea, or vomiting noted at this time. 04/15/2019 upon evaluation today patient actually appears to be doing quite well with regard to her lower extremities. In fact the right lower extremity seems like it is completely healed which is great news. The left lower extremity though not healed appears to be doing much better as well which is also excellent news. Overall I am happy with the progress that is been made in the interim since last week. Unfortunately she still having some issues with her sacral region she states this itches terribly. 04/22/2019 upon evaluation today patient appears to be doing better in regard to her lower extremity ulcers at this time which is good news. Fortunately there is no signs of active infection at this time which is also good news.She did see her primary care provider as well with regard to the rash in the sacral/gluteal region in general. Subsequently it sounds like they did do a culture and based on the acyclovir that was prescribed I would assume this was probably a viral culture. Again this is the first day I really been able to see the wound well and it sounds like that they were thinking is along the same lines I was prior to hearing that she was placed on the acyclovir which is that this could be a herpes type outbreak. It is definitely painful enough and possible given the location. 05/09/2019 on evaluation today patient actually appears to be doing excellent in regard to her lower extremity ulcers. This is completely healed. With regard to her sacral region this is not completely healed but appears to be more of a rash at this point. She is seeing Dr. Adolphus Birchwoodasher  who is a Ship broker[place] dermatologist and he did do a biopsy as well of the area in order to  evaluate for what may be going on. In the interim since she was last seen here in the office she was in the ER as well where she did have testing for herpes as they thought that might be the cause of her rash. With that being said the dermatologist really does not think that is the case. They are waiting the results of both test however. Nonetheless Dr. Adolphus Birchwoodasher is managing her rash in the sacral region at this point. This is not really a true wound as it is anyway. The patient feels like she is doing better. Electronic Signature(s) Signed: 05/09/2019 9:26:31 AM By: Lenda KelpStone III, Hoyt PA-C Entered By: Lenda KelpStone III, Hoyt on 05/09/2019 09:26:31 Nichole Martin, Nichole Martin ([id number]) -------------------------------------------------------------------------------- Physical Exam Details Patient Name: Nichole Martin, Nichole Martin Date of Service: 05/09/2019 8:45 AM Medical Record Number: [id number] Patient Account Number: [id number] Date of Birth/Sex: 06/26/1970 (48 y.o. F) Treating RN: Curtis Sitesorthy, Joanna Primary Care Provider: Tonia GhentBENDER, ABBY Other Clinician: Referring Provider: Tonia GhentBENDER, ABBY Treating Provider/Extender: STONE III, HOYT Weeks in Treatment: 4 Constitutional Well-nourished and well-hydrated in no acute distress. Respiratory normal breathing without difficulty. clear to auscultation bilaterally. Cardiovascular regular rate and rhythm with normal S1, S2. Psychiatric this patient is able to make decisions and demonstrates good insight into disease process. Alert and Oriented x 3. pleasant and cooperative. Notes Patient's wound bed currently showed signs of complete healing and resolution in regard to her lower extremities I see no issues in that regard. With regard to the sacral wound this is still open in a few areas but seems to be doing dramatically better and is healed quite a bit compared to the last time I saw her. Nonetheless I did see the biopsy sites as well where Dr. Adolphus Birchwoodasher did take a sample.  Nonetheless at this point since he is managing the rash in that area I am inclined to just transfer care to him in that regard. Electronic Signature(s) Signed: 05/09/2019 9:27:03 AM By: Lenda KelpStone III, Hoyt PA-C Entered By: Lenda KelpStone III, Hoyt on 05/09/2019 09:27:03 Nichole Martin, Nichole Martin ([id number]) -------------------------------------------------------------------------------- Physician Orders Details Patient Name: Nichole Martin, Nichole Martin Date of Service: 05/09/2019 8:45 AM Medical Record Number: [id number] Patient Account Number: [id number] Date of Birth/Sex: 08/19/1970 (48 y.o. F) Treating RN: Curtis Sitesorthy, Joanna Primary Care Provider: Tonia GhentBENDER, ABBY Other Clinician: Referring Provider: Tonia GhentBENDER, ABBY Treating Provider/Extender: Linwood DibblesSTONE III, HOYT Weeks in Treatment: 4 Verbal / Phone Orders: No Diagnosis Coding ICD-10 Coding Code Description I87.2 Venous insufficiency (chronic) (peripheral) L97.812 Non-pressure chronic ulcer of other part of right lower leg with fat layer exposed L97.822 Non-pressure chronic ulcer of other part of left lower leg with fat layer exposed L98.492 Non-pressure chronic ulcer of skin of other sites with fat layer exposed M06.89 Other specified rheumatoid arthritis, multiple sites [facility]charge [facility] Center - Continue your treatment with Dermatology Electronic Signature(s) Signed: 05/09/2019 5:08:51 PM By: Curtis Sitesorthy, Joanna Signed: 05/10/2019 11:37:39 PM By: Lenda KelpStone III, Hoyt PA-C Entered By: Curtis Sitesorthy, Joanna on 05/09/2019 09:22:34 Nichole Martin, Nichole Martin ([id number]) -------------------------------------------------------------------------------- Problem List Details Patient Name: Nichole Martin, Nichole Martin Date of Service: 05/09/2019 8:45 AM Medical Record Number: [id number] Patient Account Number: [id number] Date of Birth/Sex: 12/27/1970 (48 y.o. F) Treating RN: Curtis Sitesorthy, Joanna Primary Care Provider: Tonia GhentBENDER, ABBY Other Clinician: Referring  Provider: Tonia GhentBENDER, ABBY Treating Provider/Extender: Linwood DibblesSTONE III, HOYT Weeks in Treatment: 4 Active Problems ICD-10 Evaluated Encounter Code Description Active Date  Today Diagnosis I87.2 Venous insufficiency (chronic) (peripheral) 04/07/2019 No Yes L97.812 Non-pressure chronic ulcer of other part of right lower leg 04/07/2019 No Yes with fat layer exposed L97.822 Non-pressure chronic ulcer of other part of left lower leg with 04/07/2019 No Yes fat layer exposed L98.492 Non-pressure chronic ulcer of skin of other sites with fat layer 04/15/2019 No Yes exposed M06.89 Other specified rheumatoid arthritis, multiple sites 04/07/2019 No Yes Inactive Problems Resolved Problems Electronic Signature(s) Signed: 05/09/2019 9:12:22 AM By: Lenda Kelp PA-C Entered By: Lenda Kelp on 05/09/2019 09:12:22 Levada Dy, Rosey Bath ([id number]) -------------------------------------------------------------------------------- Progress Note Details Patient Name: Nichole China Date of Service: 05/09/2019 8:45 AM Medical Record Number: [id number] Patient Account Number: [id number] Date of Birth/Sex: 1971/04/15 (48 y.o. F) Treating RN: Curtis Sites Primary Care Provider: Tonia Ghent Other Clinician: Referring Provider: Tonia Ghent Treating Provider/Extender: Linwood Dibbles, HOYT Weeks in Treatment: 4 Subjective Chief Complaint Information obtained from Patient Bilateral LE Ulcers History of Present Illness (HPI) 04/07/19 patient presents today for initial evaluation our clinic concerning issues that she's been having with her bilateral lower extremities. She does have some lower extremity edema evidence of venous insufficiency as well. She tells me that the wounds in general seem to be doing somewhat better after she began to take some oral ampicillin [place] from [place]. When she had the Keflex he unfortunately developed a rash and ended up having an issue with the rash on  her gluteal and anal region. She has been putting the gentian violet over the rash in the gluteal region as well as over her lower extremities. She feels like this is helping to dry things out. Fortunately there is no evidence of active infection at this time although she was in the hospital for cellulitis as well. The patient does state history of rheumatoid arthritis although she doesn't have a current doctor that she sees. No fevers, chills, nausea, or vomiting noted at this time. 04/15/2019 upon evaluation today patient actually appears to be doing quite well with regard to her lower extremities. In fact the right lower extremity seems like it is completely healed which is great news. The left lower extremity though not healed appears to be doing much better as well which is also excellent news. Overall I am happy with the progress that is been made in the interim since last week. Unfortunately she still having some issues with her sacral region she states this itches terribly. 04/22/2019 upon evaluation today patient appears to be doing better in regard to her lower extremity ulcers at this time which is good news. Fortunately there is no signs of active infection at this time which is also good news.She did see her primary care provider as well with regard to the rash in the sacral/gluteal region in general. Subsequently it sounds like they did do a culture and based on the acyclovir that was prescribed I would assume this was probably a viral culture. Again this is the first day I really been able to see the wound well and it sounds like that they were thinking is along the same lines I was prior to hearing that she was placed on the acyclovir which is that this could be a herpes type outbreak. It is definitely painful enough and possible given the location. 05/09/2019 on evaluation today patient actually appears to be doing excellent in regard to her lower extremity ulcers. This is completely  healed. With regard to her sacral region this is not completely healed  but appears to be more of a rash at this point. She is seeing Dr. Evorn Gong who is a local dermatologist and he did do a biopsy as well of the area in order to evaluate for what may be going on. In the interim since she was last seen here in the office she was in the ER as well where she did have testing for herpes as they thought that might be the cause of her rash. With that being said the dermatologist really does not think that is the case. They are waiting the results of both test however. Nonetheless Dr. Evorn Gong is managing her rash in the sacral region at this point. This is not really a true wound as it is anyway. The patient feels like she is doing better. Patient History Information obtained from Patient. Family History Heart Disease - Mother,Father, Hypertension - Mother,Father, Lung Disease - Father, No family history of Cancer, Diabetes, Hereditary Spherocytosis, Kidney Disease, Seizures, Stroke, Thyroid Problems, Tuberculosis. Social History Never smoker, Marital Status - Single, Alcohol Use - Never, Drug Use - No History, Caffeine Use - Never. MEHA, VIDRINE ([id number]) Medical History Respiratory Patient has history of Asthma Denies history of Aspiration, Chronic Obstructive Pulmonary Disease (COPD), Pneumothorax, Sleep Apnea, Tuberculosis Integumentary (Skin) Denies history of History of Burn, History of pressure wounds Musculoskeletal Patient has history of Rheumatoid Arthritis, Osteoarthritis Denies history of Gout, Osteomyelitis Neurologic Denies history of Dementia, Neuropathy, Quadriplegia, Paraplegia, Seizure Disorder Medical And Surgical History Notes Neurologic migraines Review of Systems (ROS) Constitutional Symptoms (General Health) Denies complaints or symptoms of Fatigue, Fever, Chills, Marked Weight Change. Respiratory Denies complaints or symptoms of Chronic or frequent coughs,  Shortness of Breath. Cardiovascular Denies complaints or symptoms of Chest pain, LE edema. Psychiatric Denies complaints or symptoms of Anxiety, Claustrophobia. Objective Constitutional Well-nourished and well-hydrated in no acute distress. Vitals Time Taken: 8:50 AM, Height: 66 in, Weight: 221 lbs, BMI: 35.7, Temperature: 98.3 F, Pulse: 85 bpm, Respiratory Rate: 18 breaths/min, Blood Pressure: 144/70 mmHg. Respiratory normal breathing without difficulty. clear to auscultation bilaterally. Cardiovascular regular rate and rhythm with normal S1, S2. Psychiatric this patient is able to make decisions and demonstrates good insight into disease process. Alert and Oriented x 3. pleasant and cooperative. General Notes: Patient's wound bed currently showed signs of complete healing and resolution in regard to her lower extremities I see no issues in that regard. With regard to the sacral wound this is still open in a few areas but seems to be doing dramatically better and is healed quite a bit compared to the last time I saw her. Nonetheless I did see the biopsy sites as well where Dr. Evorn Gong did take a sample. Nonetheless at this point since he is managing the rash in that area I am inclined [facility], Nichole Martin ([id number]) to just transfer care to him in that regard. Integumentary (Hair, Skin) Wound #2 status is Healed - Epithelialized. Original cause of wound was Gradually Appeared. The wound is located on the Left,Circumferential Lower Leg. The wound measures 0cm length x 0cm width x 0cm depth; 0cm^2 area and 0cm^3 volume. There is no tunneling or undermining noted. There is a none present amount of drainage noted. The wound margin is flat and intact. There is large (67-100%) pink granulation within the wound bed. There is a small (1-33%) amount of necrotic tissue within the wound bed including Adherent Slough. Wound #3 status is Open. Original cause of wound was Gradually Appeared.  The wound is located on  the Midline Sacrum. The wound measures 15cm length x 2.5cm width x 0.1cm depth; 29.452cm^2 area and 2.945cm^3 volume. There is Fat Layer (Subcutaneous Tissue) Exposed exposed. There is no tunneling or undermining noted. There is a medium amount of serous drainage noted. The wound margin is indistinct and nonvisible. There is medium (34-66%) pink granulation within the wound bed. There is a medium (34-66%) amount of necrotic tissue within the wound bed including Eschar and Adherent Slough. 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 My suggestion for the patient at this point would be to continue with the triamcinolone cream that she has been given by dermatology for the sacral region. I did suggest that she put a piece of gauze behind this to absorb some of the extra moisture so that it does not get too macerated. 2. I would recommend as well that the patient continue to provide good offloading although I really do not feel like pressure is a major contributing factor to this. 3. I would recommend as well that she follow-up as needed here in the clinic if anything changes or worsens and she needs additional wound care. With that being said right now it looks like Dr. Adolphus Birchwood is managing the rash in the sacral region. We will see the patient back for a follow-up visit as needed. Electronic Signature(s) Signed: 05/09/2019 9:28:12 AM By: Lenda Kelp PA-C Entered By: Lenda Kelp on 05/09/2019 09:28:11 Levada Dy, Rosey Bath  ([id number]) [address], Nichole Martin ([id number]) -------------------------------------------------------------------------------- ROS/PFSH Details Patient Name: Nichole China Date of Service: 05/09/2019 8:45 AM Medical Record Number: [id number] Patient Account Number: [id number] Date of Birth/Sex: Nov 09, 1970 (48 y.o. F) Treating RN: Curtis Sites Primary Care Provider: Tonia Ghent Other Clinician: Referring Provider: Tonia Ghent Treating Provider/Extender: Linwood Dibbles, HOYT Weeks in Treatment: 4 Information Obtained From Patient Constitutional Symptoms (General Health) Complaints and Symptoms: Negative for: Fatigue; Fever; Chills; Marked Weight Change Respiratory Complaints and Symptoms: Negative for: Chronic or frequent coughs; Shortness of Breath Medical History: Positive for: Asthma Negative for: Aspiration; Chronic Obstructive Pulmonary Disease (COPD); Pneumothorax; Sleep Apnea; Tuberculosis Cardiovascular Complaints and Symptoms: Negative for: Chest pain; LE edema Psychiatric Complaints and Symptoms: Negative for: Anxiety; Claustrophobia Integumentary (Skin) Medical History: Negative for: History of Burn; History of pressure wounds Musculoskeletal Medical History: Positive for: Rheumatoid Arthritis; Osteoarthritis Negative for: Gout; Osteomyelitis Neurologic Medical History: Negative for: Dementia; Neuropathy; Quadriplegia; Paraplegia; Seizure Disorder Past Medical History Notes: migraines Immunizations Pneumococcal Vaccine: Received Pneumococcal Vaccination: No Immunization Notes: up to date CHRYSTIAN, CARROTHERS ([id number]) Implantable Devices None Family and Social History Cancer: No; Diabetes: No; Heart Disease: Yes - Mother,Father; Hereditary Spherocytosis: No; Hypertension: Yes - Mother,Father; Kidney Disease: No; Lung Disease: Yes - Father; Seizures: No; Stroke: No; Thyroid Problems: No; Tuberculosis: No; Never smoker; Marital Status -  Single; Alcohol Use: Never; Drug Use: No History; Caffeine Use: Never; Financial Concerns: No; Food, Clothing or Shelter Needs: No; Support System Lacking: No; Transportation Concerns: No Physician Affirmation I have reviewed and agree with the above information. Electronic Signature(s) Signed: 05/09/2019 5:08:51 PM By: Curtis Sites Signed: 05/10/2019 11:37:39 PM By: Lenda Kelp PA-C Entered By: Linwood Dibbles,  Hoyt on 05/09/2019 09:26:47 AYODELE, HARTSOCK ([id number]) -------------------------------------------------------------------------------- SuperBill Details Patient Name: Nichole China Date of Service: 05/09/2019 Medical Record Number: [id number] Patient Account Number: [id number] Date of Birth/Sex: 16-Jan-1971 (48 y.o. F) Treating RN: Curtis Sites Primary Care Provider: Tonia Ghent Other Clinician: Referring Provider: Tonia Ghent Treating Provider/Extender: Linwood Dibbles, HOYT Weeks in Treatment: 4 Diagnosis Coding ICD-10 Codes Code Description I87.2 Venous insufficiency (chronic) (peripheral) L97.812 Non-pressure chronic ulcer of other part of right lower leg with fat layer exposed L97.822 Non-pressure chronic ulcer of other part of left lower leg with fat layer exposed L98.492 Non-pressure chronic ulcer of skin of other sites with fat layer exposed M06.89 Other specified rheumatoid arthritis, multiple sites Facility Procedures CPT4 Code: 62952841 Description: 99213 - WOUND CARE VISIT-LEV 3 EST PT Modifier: Quantity: 1 Physician Procedures CPT4 Code Description: 3244010 99214 - WC PHYS LEVEL 4 - EST PT ICD-10 Diagnosis Description I87.2 Venous insufficiency (chronic) (peripheral) L97.812 Non-pressure chronic ulcer of other part of right lower leg wi L97.822 Non-pressure chronic ulcer of  other part of left lower leg wit L98.492 Non-pressure chronic ulcer of skin of other sites with fat lay Modifier: th fat layer expo h fat layer expos er exposed Quantity: 1  sed ed Electronic Signature(s) Signed: 05/09/2019 10:17:35 AM By: Curtis Sites Signed: 05/10/2019 11:37:39 PM By: Lenda Kelp PA-C Previous Signature: 05/09/2019 9:28:25 AM Version By: Lenda Kelp PA-C Entered By: Curtis Sites on 05/09/2019 10:17:34

## 2020-03-16 ENCOUNTER — Emergency Department
Admission: EM | Admit: 2020-03-16 | Discharge: 2020-03-16 | Disposition: A | Discharge status: Home / Self Care | Payer: Self-pay | Attending: Emergency Medicine | Admitting: Emergency Medicine

## 2020-03-16 ENCOUNTER — Other Ambulatory Visit: Payer: Self-pay

## 2020-03-16 DIAGNOSIS — J45909 Unspecified asthma, uncomplicated: Secondary | ICD-10-CM | POA: Insufficient documentation

## 2020-03-16 DIAGNOSIS — G43909 Migraine, unspecified, not intractable, without status migrainosus: Secondary | ICD-10-CM | POA: Insufficient documentation

## 2020-03-16 HISTORY — DX: Migraine, unspecified, not intractable, without status migrainosus: G43.909

## 2020-03-16 LAB — CBC
HCT: 45 % (ref 36.0–46.0)
Hemoglobin: 14.8 g/dL (ref 12.0–15.0)
MCH: 27.5 pg (ref 26.0–34.0)
MCHC: 32.9 g/dL (ref 30.0–36.0)
MCV: 83.6 fL (ref 80.0–100.0)
Platelets: 122 10*3/uL — ABNORMAL LOW (ref 150–400)
RBC: 5.38 MIL/uL — ABNORMAL HIGH (ref 3.87–5.11)
RDW: 12.7 % (ref 11.5–15.5)
WBC: 6.4 10*3/uL (ref 4.0–10.5)
nRBC: 0 % (ref 0.0–0.2)

## 2020-03-16 LAB — BASIC METABOLIC PANEL
Anion gap: 13 (ref 5–15)
BUN: 22 mg/dL — ABNORMAL HIGH (ref 6–20)
CO2: 23 mmol/L (ref 22–32)
Calcium: 9.4 mg/dL (ref 8.9–10.3)
Chloride: 102 mmol/L (ref 98–111)
Creatinine, Ser: 0.67 mg/dL (ref 0.44–1.00)
GFR, Estimated: >60 mL/min (ref >60)
Glucose, Bld: 93 mg/dL (ref 70–99)
Potassium: 4.1 mmol/L (ref 3.5–5.1)
Sodium: 138 mmol/L (ref 135–145)

## 2020-03-16 LAB — TROPONIN I (HIGH SENSITIVITY): Troponin I (High Sensitivity): 4 ng/L (ref <18)

## 2020-03-16 MED ORDER — LACTATED RINGERS IV BOLUS
1000.0000 mL | Freq: Once | INTRAVENOUS | Status: AC
  Administered 2020-03-16: 1000 mL via INTRAVENOUS

## 2020-03-16 MED ORDER — PROCHLORPERAZINE EDISYLATE 10 MG/2ML IJ SOLN
10.0000 mg | Freq: Once | INTRAMUSCULAR | Status: AC
  Administered 2020-03-16: 10 mg via INTRAVENOUS
  Filled 2020-03-16: qty 2

## 2020-03-16 MED ORDER — DIPHENHYDRAMINE HCL 50 MG/ML IJ SOLN
25.0000 mg | Freq: Once | INTRAMUSCULAR | Status: AC
  Administered 2020-03-16: 25 mg via INTRAVENOUS
  Filled 2020-03-16: qty 1

## 2020-03-16 MED ORDER — PROCHLORPERAZINE MALEATE 10 MG PO TABS: 10.0000 mg | ORAL_TABLET | Freq: Four times a day (QID) | ORAL | 0 refills | Status: AC | PRN

## 2020-03-16 NOTE — ED Provider Notes (Addendum)
[facility] Emergency Department Provider Note   ____________________________________________   First MD Initiated Contact with Patient 03/16/20 1833     (approximate)  I have reviewed the triage vital signs and the nursing notes.   HISTORY  Chief Complaint Headache    HPI Nichole Martin is a 49 y.o. female with past medical history of rheumatoid arthritis, migraines, and asthma who presents to the ED complaining of headache.  Patient reports she has had approximately 1 week of gradually worsening headache.  She describes it as a dull ache that affects both sides of the front and back of her head.  Pain is constant and occasionally exacerbated with exertion.  She reports some nausea but has not had any vomiting, denies any fevers, chills, cough, chest pain, shortness of breath, dysuria, or hematuria.  She describes symptoms as similar to prior migraines, however she has been feeling more dizzy than she usually does with headaches.  She describes an occasional feeling like the room is spinning around her that comes and goes, but is not associated with positional changes.  She will occasionally have similar dizziness with migraines, but today it is more severe.  She denies any vision changes, numbness, or weakness.        Past Medical History:  Diagnosis Date   Arthritis    Asthma    Migraine    Rheumatoid arthritis (HCC)     Patient Active Problem List   Diagnosis Date Noted   Rash of genitalia    Pain    Obesity, Class III, BMI 40-49.9 (morbid obesity) (HCC)    Rash 04/29/2019   Rheumatoid arthritis of foot (HCC)    Obesity (BMI 35.0-39.9 without comorbidity)     Past Surgical History:  Procedure Laterality Date   CESAREAN SECTION     CHOLECYSTECTOMY      Prior to Admission medications   Medication Sig Start Date End Date Taking? Authorizing Provider  oxyCODONE (OXY IR/ROXICODONE) 5 MG immediate release tablet Take 1  tablet (5 mg total) by mouth every 6 (six) hours as needed for severe pain. 04/30/19   Enedina Finner, MD  prochlorperazine (COMPAZINE) 10 MG tablet Take 1 tablet (10 mg total) by mouth every 6 (six) hours as needed for nausea or vomiting. 03/16/20   Chesley Noon, MD  valACYclovir (VALTREX) 1000 MG tablet Take 1 tablet (1,000 mg total) by mouth 2 (two) times daily. 04/30/19   Enedina Finner, MD    Allergies Morphine and related, Cephalexin, and Indomethacin  No family history on file.  Social History Social History   Tobacco Use   Smoking status: Never Smoker   Smokeless tobacco: Never Used  Substance Use Topics   Alcohol use: Never   Drug use: Never    Review of Systems  Constitutional: No fever/chills Eyes: No visual changes. ENT: No sore throat. Cardiovascular: Denies chest pain. Respiratory: Denies shortness of breath. Gastrointestinal: No abdominal pain.  No nausea, no vomiting.  No diarrhea.  No constipation. Genitourinary: Negative for dysuria. Musculoskeletal: Negative for back pain. Skin: Negative for rash. Neurological: Positive for headaches and dizziness, negative for focal weakness or numbness.  ____________________________________________   PHYSICAL EXAM:  VITAL SIGNS: ED Triage Vitals  Enc Vitals Group     BP 03/16/20 1609 (!) 171/18     Pulse Rate 03/16/20 1609 70     Resp 03/16/20 1609 18     Temp 03/16/20 1853 97.8 F (36.6 C)     Temp Source 03/16/20  1853 Oral     SpO2 03/16/20 1609 94 %     Weight 03/16/20 1610 220 lb (99.8 kg)     Height 03/16/20 1610 5\' 5"  (1.651 m)     Head Circumference --      Peak Flow --      Pain Score 03/16/20 1609 10     Pain Loc --      Pain Edu? --      Excl. in GC? --     Constitutional: Alert and oriented. Eyes: Conjunctivae are normal.  Pupils equal round and reactive to light bilaterally, extraocular movements intact. Head: Atraumatic. Nose: No congestion/rhinnorhea. Mouth/Throat: Mucous membranes are  moist. Neck: Normal ROM, no nuchal rigidity noted. Cardiovascular: Normal rate, regular rhythm. Grossly normal heart sounds. Respiratory: Normal respiratory effort.  No retractions. Lungs CTAB. Gastrointestinal: Soft and nontender. No distention. Genitourinary: deferred Musculoskeletal: No lower extremity tenderness nor edema. Neurologic:  Normal speech and language. No gross focal neurologic deficits are appreciated. Skin:  Skin is warm, dry and intact. No rash noted. Psychiatric: Mood and affect are normal. Speech and behavior are normal.  ____________________________________________   LABS (all labs ordered are listed, but only abnormal results are displayed)  Labs Reviewed  BASIC METABOLIC PANEL - Abnormal; Notable for the following components:      Result Value   BUN 22 (*)    All other components within normal limits  CBC - Abnormal; Notable for the following components:   RBC 5.38 (*)    Platelets 122 (*)    All other components within normal limits  URINALYSIS, COMPLETE (UACMP) WITH MICROSCOPIC  TROPONIN I (HIGH SENSITIVITY)    PROCEDURES  Procedure(s) performed (including Critical Care):  Procedures  ED ECG REPORT I, 05/16/20, the attending physician, personally viewed and interpreted this ECG.   Date: 03/16/2020  EKG Time: 16:15  Rate: 63  Rhythm: normal sinus rhythm  Axis: Normal  Intervals:none  ST&T Change: none  ____________________________________________   INITIAL IMPRESSION / ASSESSMENT AND PLAN / ED COURSE       49 year old female with past medical history of rheumatoid arthritis, migraines, and asthma presents to the ED with 1 week of gradually worsening headache associated with dizziness and feeling like the room is spinning around her.  She has no focal neurologic deficits on exam.  Given gradual onset of headache, I doubt SAH, and there is no indication for advanced imaging at this time.  Low suspicion for meningitis.  Stroke also seems  unlikely as she has had similar dizziness with headaches in the past and her recent dizziness is present intermittently.  We will treat with migraine cocktail and reassess.  Lab work is reassuring, pregnancy testing not indicated as patient is postmenopausal.  On reassessment, patient reports that headache is improving and dizziness has resolved.  I suspect her symptoms are due to migraine and she is appropriate for discharge home with PCP follow-up.  She will be prescribed Compazine for use as needed.  She was counseled to return to the ED for new worsening symptoms, patient agrees with plan.      ____________________________________________   FINAL CLINICAL IMPRESSION(S) / ED DIAGNOSES  Final diagnoses:  Migraine without status migrainosus, not intractable, unspecified migraine type     ED Discharge Orders         Ordered    prochlorperazine (COMPAZINE) 10 MG tablet  Every 6 hours PRN        03/16/20 2107  Note:  This document was prepared using Dragon voice recognition software and may include unintentional dictation errors.   Chesley Noon, MD 03/16/20 2108    Chesley Noon, MD 03/16/20 2112

## 2020-03-16 NOTE — ED Notes (Signed)
Lab called in triage to obtain blood sample  lw edt

## 2020-03-16 NOTE — ED Notes (Addendum)
Assessment completed using spanish interpreter- Pt c/ HA x1 week, front and back. HA is constant, exertion increases it. C.o nausea, no vomiting. Denies fevers. Reports weakness all over, dizziness like the world is spinning. States left eye is smaller.

## 2020-03-16 NOTE — ED Triage Notes (Signed)
PT to ED for headache x1 week. PT states episode of feeling "drunk" yesterday and caused her to get dizzy and pass out, pt came to on the floor. Estimates she was only out for a few seconds. Occasional photophobia. PT with hx of migraines but pt states she doesn't usually get dizzy. Also endorses L sided chest cramping. PT has attempted to tx with OTC meds with no relief.

## 2020-08-06 IMAGING — DX DG HAND 2V*L*
2 series · 2 of 2 positions shown · non-contrast
Comparison: None.

CLINICAL DATA: 48-year-old female with pain in both hands.

EXAM:
RIGHT HAND - 2 VIEW; LEFT HAND - 2 VIEW

[hand ap]
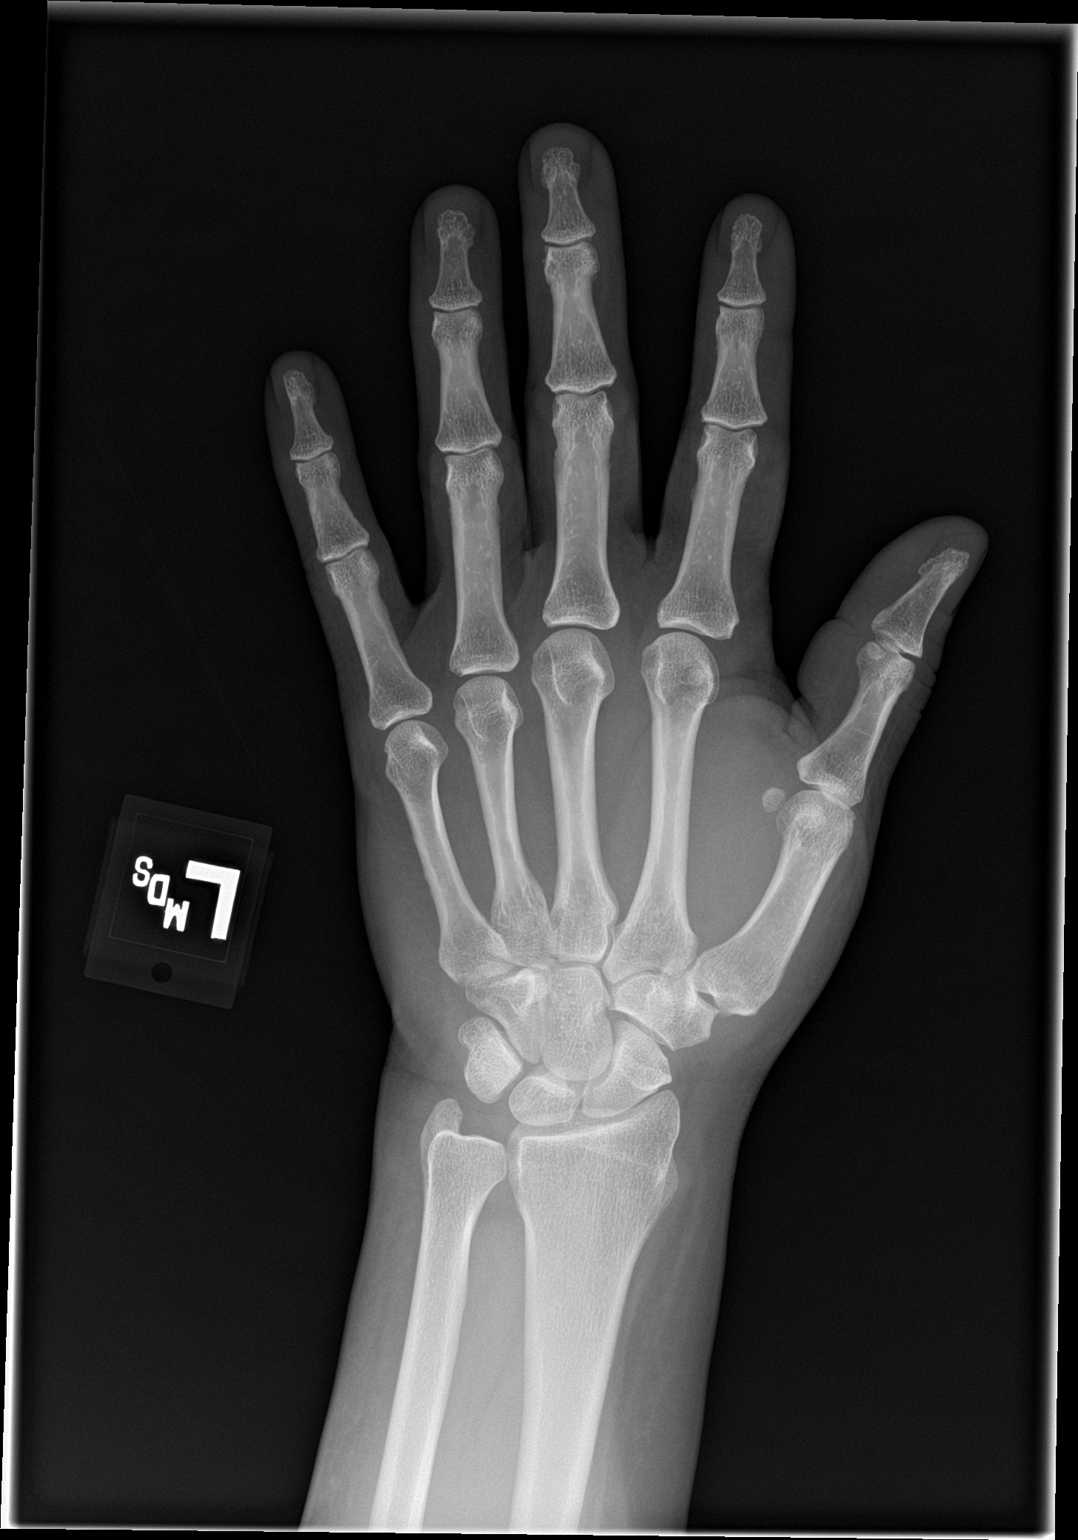

[hand lat]
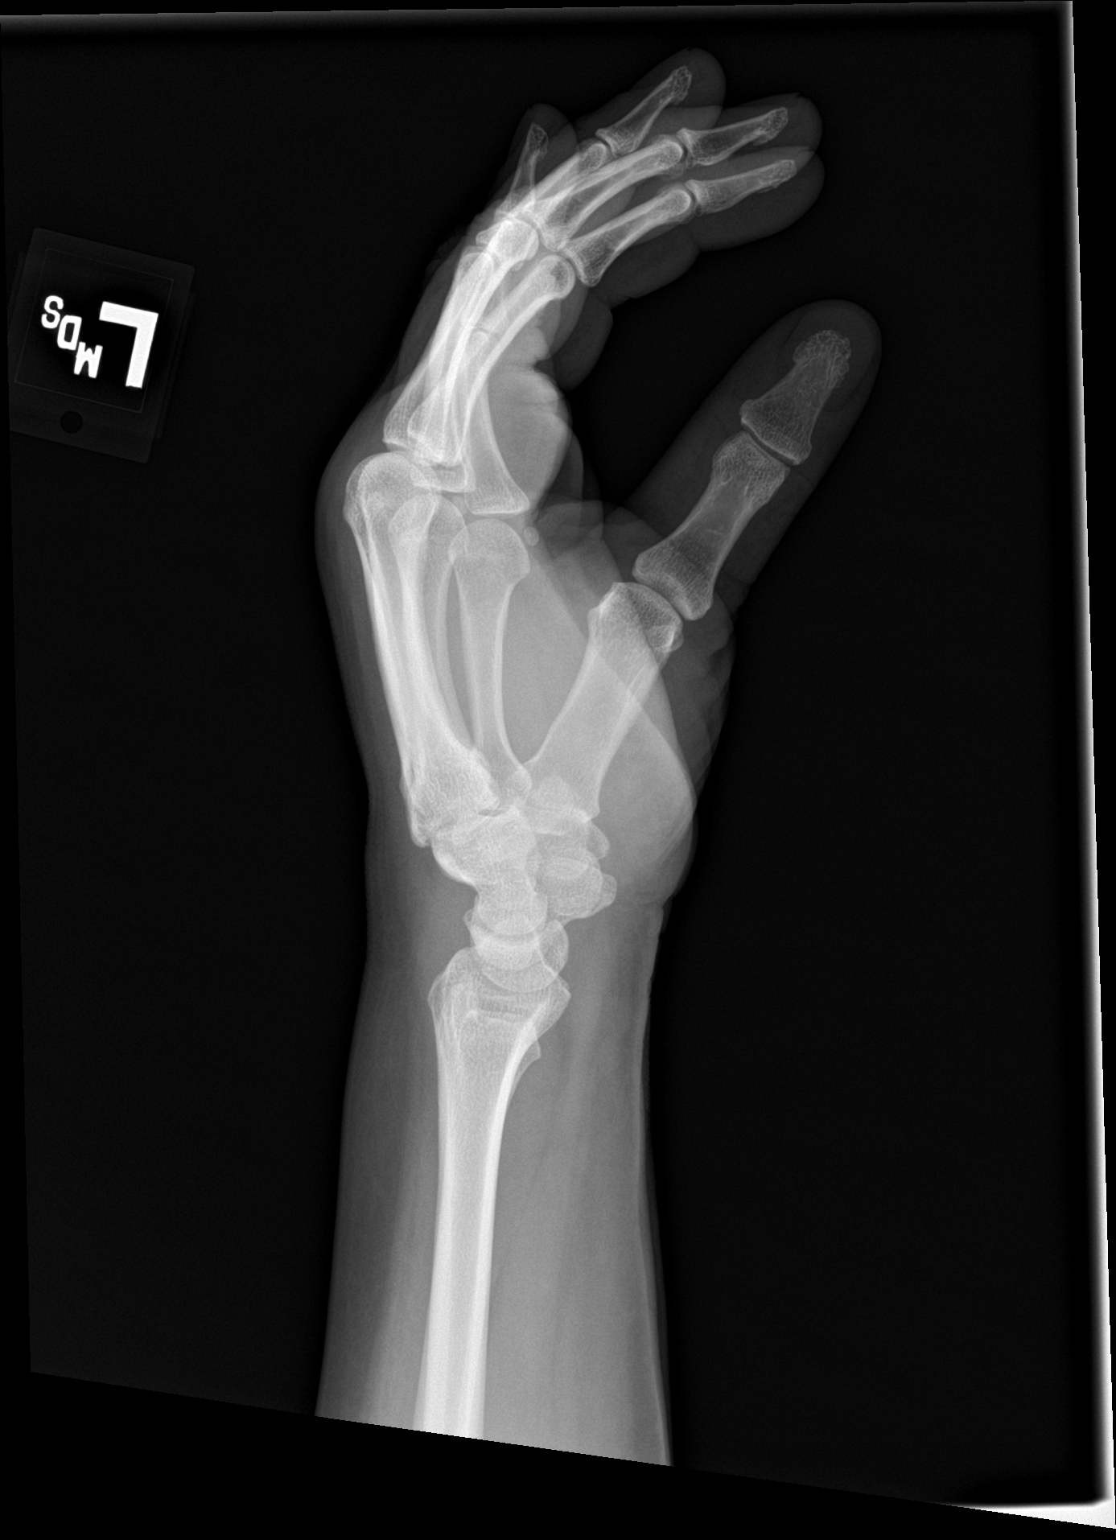

[2 of 2 positions shown; findings below may reference images not displayed]

FINDINGS: There is no acute fracture or dislocation. Mild juxta-articular
osteopenia. No arthritic changes. The soft tissues are unremarkable.
IMPRESSION: No acute fracture or dislocation.

## 2022-04-14 LAB — HSV CULTURE AND TYPING
